# Patient Record
Sex: Female | Born: 1965 | Race: White | Hispanic: No | Marital: Married | Smoking: Never smoker
Health system: Southern US, Community
[De-identification: ages and names within clinical notes are randomized; demographics above are authoritative.]

## PROBLEM LIST (undated history)

## (undated) DIAGNOSIS — M719 Bursopathy, unspecified: Secondary | ICD-10-CM

## (undated) DIAGNOSIS — F329 Major depressive disorder, single episode, unspecified: Secondary | ICD-10-CM

## (undated) DIAGNOSIS — H9190 Unspecified hearing loss, unspecified ear: Secondary | ICD-10-CM

## (undated) DIAGNOSIS — T7840XA Allergy, unspecified, initial encounter: Secondary | ICD-10-CM

## (undated) DIAGNOSIS — E785 Hyperlipidemia, unspecified: Secondary | ICD-10-CM

## (undated) DIAGNOSIS — I10 Essential (primary) hypertension: Secondary | ICD-10-CM

## (undated) DIAGNOSIS — F419 Anxiety disorder, unspecified: Secondary | ICD-10-CM

## (undated) DIAGNOSIS — F32A Depression, unspecified: Secondary | ICD-10-CM

## (undated) DIAGNOSIS — I639 Cerebral infarction, unspecified: Secondary | ICD-10-CM

## (undated) DIAGNOSIS — F988 Other specified behavioral and emotional disorders with onset usually occurring in childhood and adolescence: Secondary | ICD-10-CM

## (undated) DIAGNOSIS — G43909 Migraine, unspecified, not intractable, without status migrainosus: Secondary | ICD-10-CM

## (undated) HISTORY — DX: Migraine, unspecified, not intractable, without status migrainosus: G43.909

## (undated) HISTORY — DX: Hyperlipidemia, unspecified: E78.5

## (undated) HISTORY — DX: Cerebral infarction, unspecified: I63.9

## (undated) HISTORY — DX: Depression, unspecified: F32.A

## (undated) HISTORY — PX: TONSILLECTOMY: SUR1361

## (undated) HISTORY — PX: PECTUS EXCAVATUM REPAIR: SHX437

## (undated) HISTORY — DX: Other specified behavioral and emotional disorders with onset usually occurring in childhood and adolescence: F98.8

## (undated) HISTORY — DX: Allergy, unspecified, initial encounter: T78.40XA

## (undated) HISTORY — PX: SPLENECTOMY: SUR1306

## (undated) HISTORY — DX: Bursopathy, unspecified: M71.9

## (undated) HISTORY — PX: WISDOM TOOTH EXTRACTION: SHX21

## (undated) HISTORY — PX: CHOLECYSTECTOMY: SHX55

## (undated) HISTORY — DX: Unspecified hearing loss, unspecified ear: H91.90

## (undated) HISTORY — DX: Anxiety disorder, unspecified: F41.9

## (undated) HISTORY — DX: Major depressive disorder, single episode, unspecified: F32.9

---

## 2010-01-11 DIAGNOSIS — I639 Cerebral infarction, unspecified: Secondary | ICD-10-CM

## 2010-01-11 HISTORY — DX: Cerebral infarction, unspecified: I63.9

## 2014-05-21 ENCOUNTER — Ambulatory Visit (INDEPENDENT_AMBULATORY_CARE_PROVIDER_SITE_OTHER): Payer: BLUE CROSS/BLUE SHIELD | Admitting: Family Medicine

## 2014-05-21 ENCOUNTER — Encounter: Payer: Self-pay | Admitting: Family Medicine

## 2014-05-21 VITALS — BP 120/80 | HR 72 | Wt 155.2 lb

## 2014-05-21 DIAGNOSIS — J301 Allergic rhinitis due to pollen: Secondary | ICD-10-CM

## 2014-05-21 DIAGNOSIS — F411 Generalized anxiety disorder: Secondary | ICD-10-CM

## 2014-05-21 DIAGNOSIS — I10 Essential (primary) hypertension: Secondary | ICD-10-CM | POA: Diagnosis not present

## 2014-05-21 DIAGNOSIS — Z8673 Personal history of transient ischemic attack (TIA), and cerebral infarction without residual deficits: Secondary | ICD-10-CM

## 2014-05-21 DIAGNOSIS — Z8742 Personal history of other diseases of the female genital tract: Secondary | ICD-10-CM | POA: Diagnosis not present

## 2014-05-21 DIAGNOSIS — H903 Sensorineural hearing loss, bilateral: Secondary | ICD-10-CM

## 2014-05-21 DIAGNOSIS — F909 Attention-deficit hyperactivity disorder, unspecified type: Secondary | ICD-10-CM | POA: Diagnosis not present

## 2014-05-21 DIAGNOSIS — N3281 Overactive bladder: Secondary | ICD-10-CM | POA: Diagnosis not present

## 2014-05-21 DIAGNOSIS — F988 Other specified behavioral and emotional disorders with onset usually occurring in childhood and adolescence: Secondary | ICD-10-CM | POA: Insufficient documentation

## 2014-05-21 DIAGNOSIS — J309 Allergic rhinitis, unspecified: Secondary | ICD-10-CM | POA: Insufficient documentation

## 2014-05-21 NOTE — Progress Notes (Signed)
   Subjective:    Patient ID: Shari Baxter, female    DOB: 27-Oct-1965, 49 y.o.   MRN: [id number]  HPI She is here for a get acquainted visit. She did bring her medical records with her. She has a history of hypertension as well as OAB. She also apparently had abnormal Paps. She was diagnosed recently with ADD and is presently on Ritalin. She does not have that information with her. She also has hearing aids and did bring information concerning the sensorineural hearing loss. She has remote history of CVA. She also is on Celexa for anxiety. Apparently she had also been on all often the past. She would like to be referred for counseling concerning this.  Review of Systems     Objective:   Physical Exam Alert and in no distress otherwise not examined      Assessment & Plan:  Essential hypertension  OAB (overactive bladder)  History of abnormal cervical Pap smear  ADD (attention deficit disorder)  Bilateral sensorineural hearing loss  History of CVA (cerebrovascular accident)  Anxiety state She was given the name and phone number of Darryl Hyers for counseling. She is to get her ADD information before I can renew this. I will review her record and update her file. She is to return here in one month for recheck.

## 2014-05-21 NOTE — Patient Instructions (Signed)
Shari Baxter [phone]

## 2014-05-22 ENCOUNTER — Encounter: Payer: Self-pay | Admitting: Internal Medicine

## 2014-06-05 ENCOUNTER — Encounter: Payer: Self-pay | Admitting: Family Medicine

## 2014-06-24 ENCOUNTER — Encounter: Payer: Self-pay | Admitting: Family Medicine

## 2014-06-24 ENCOUNTER — Ambulatory Visit (INDEPENDENT_AMBULATORY_CARE_PROVIDER_SITE_OTHER): Payer: BLUE CROSS/BLUE SHIELD | Admitting: Family Medicine

## 2014-06-24 VITALS — BP 112/74 | HR 64 | Wt 162.0 lb

## 2014-06-24 DIAGNOSIS — Z1239 Encounter for other screening for malignant neoplasm of breast: Secondary | ICD-10-CM

## 2014-06-24 DIAGNOSIS — F909 Attention-deficit hyperactivity disorder, unspecified type: Secondary | ICD-10-CM

## 2014-06-24 DIAGNOSIS — I10 Essential (primary) hypertension: Secondary | ICD-10-CM | POA: Diagnosis not present

## 2014-06-24 DIAGNOSIS — Z1211 Encounter for screening for malignant neoplasm of colon: Secondary | ICD-10-CM | POA: Diagnosis not present

## 2014-06-24 DIAGNOSIS — N3281 Overactive bladder: Secondary | ICD-10-CM | POA: Diagnosis not present

## 2014-06-24 DIAGNOSIS — Z8742 Personal history of other diseases of the female genital tract: Secondary | ICD-10-CM

## 2014-06-24 DIAGNOSIS — F988 Other specified behavioral and emotional disorders with onset usually occurring in childhood and adolescence: Secondary | ICD-10-CM

## 2014-06-24 MED ORDER — METHYLPHENIDATE HCL ER (CD) 10 MG PO CPCR: 10.0000 mg | ORAL_CAPSULE | ORAL | Status: DC

## 2014-06-24 NOTE — Progress Notes (Signed)
   Subjective:    Patient ID: Shari Baxter, female    DOB: 1965/08/23, 49 y.o.   MRN: [id number]  HPI She is here for a recheck. She apparently does have a history of abnormal Paps and was on every six-month cycle. She has been in this area for less than 6 months and will need a referral. She also has a history of OAB but she is doing well on that. She would like to be placed back on her ADD medication. I could not find her record of the evaluation but did see a note concerning the fact that she does have ADD. She would like to be placed back on Metadate. Review his record indicates she has not had a colonoscopy and does need a mammogram. It was last done in 2014. She is on Celexa,and apparently was placed on this by her GYN for treatment of a rash that was apparently related to anxiety.   Review of Systems     Objective:   Physical Exam Alert and in no distress otherwise not examined       Assessment & Plan:  Essential hypertension  History of abnormal cervical Pap smear - Plan: Ambulatory referral to Gynecology  OAB (overactive bladder)  ADD (attention deficit disorder) - Plan: methylphenidate (METADATE CD) 10 MG CR capsule  Special screening for malignant neoplasms, colon - Plan: Ambulatory referral to Gastroenterology  Screening for breast cancer - Plan: MM DIGITAL SCREENING BILATERAL He is to bring in her ADD evaluation slight can look at it. We cannot find it here. She will continue on her other medications. At the end of the encounter she then mentioned desire for weight loss. I recommended that she return here for further consultation concerning this. Of note is the fact that she was married 3 months ago and is going through major lifestyle changes right now.

## 2014-07-02 ENCOUNTER — Telehealth: Payer: Self-pay | Admitting: Family Medicine

## 2014-07-02 MED ORDER — METHYLPHENIDATE HCL 5 MG PO TABS: 5.0000 mg | ORAL_TABLET | Freq: Two times a day (BID) | ORAL | Status: DC

## 2014-07-02 NOTE — Telephone Encounter (Signed)
Pt stopped by and stated that she thinks the strength of the medication is too strong. She states that it is making her sick. She is requesting something less strong. Pt states that she was previously on generic ritalin 5 mg not 10 mg. She thinks that may be the correct does. Pt can be reached at [phone].

## 2014-07-02 NOTE — Telephone Encounter (Signed)
I will try her on the 5 mg Ritalin and she is to call me to let me know how it works.

## 2014-07-02 NOTE — Telephone Encounter (Signed)
Called pt and left message that  Dr Redmond School has written a new script for Ritalin 5mg  #30 for her to try and call him back to let Dr Redmond School know how she doing after trying the med

## 2014-07-22 ENCOUNTER — Encounter: Payer: BLUE CROSS/BLUE SHIELD | Admitting: Family Medicine

## 2014-07-26 LAB — HM MAMMOGRAPHY

## 2014-07-29 ENCOUNTER — Encounter: Payer: Self-pay | Admitting: Family Medicine

## 2014-07-29 ENCOUNTER — Other Ambulatory Visit (HOSPITAL_COMMUNITY)
Admission: RE | Admit: 2014-07-29 | Discharge: 2014-07-29 | Disposition: A | Discharge status: Home / Self Care | Payer: Self-pay | Source: Ambulatory Visit | Attending: Family Medicine | Admitting: Family Medicine

## 2014-07-29 ENCOUNTER — Ambulatory Visit (INDEPENDENT_AMBULATORY_CARE_PROVIDER_SITE_OTHER): Payer: BLUE CROSS/BLUE SHIELD | Admitting: Family Medicine

## 2014-07-29 VITALS — BP 114/70 | HR 60 | Ht 62.0 in | Wt 157.0 lb

## 2014-07-29 DIAGNOSIS — N3281 Overactive bladder: Secondary | ICD-10-CM | POA: Diagnosis not present

## 2014-07-29 DIAGNOSIS — Z0000 Encounter for general adult medical examination without abnormal findings: Secondary | ICD-10-CM | POA: Diagnosis not present

## 2014-07-29 DIAGNOSIS — Z8673 Personal history of transient ischemic attack (TIA), and cerebral infarction without residual deficits: Secondary | ICD-10-CM | POA: Diagnosis not present

## 2014-07-29 DIAGNOSIS — F909 Attention-deficit hyperactivity disorder, unspecified type: Secondary | ICD-10-CM

## 2014-07-29 DIAGNOSIS — I10 Essential (primary) hypertension: Secondary | ICD-10-CM | POA: Diagnosis not present

## 2014-07-29 DIAGNOSIS — F411 Generalized anxiety disorder: Secondary | ICD-10-CM | POA: Diagnosis not present

## 2014-07-29 DIAGNOSIS — J301 Allergic rhinitis due to pollen: Secondary | ICD-10-CM

## 2014-07-29 DIAGNOSIS — N390 Urinary tract infection, site not specified: Secondary | ICD-10-CM | POA: Diagnosis not present

## 2014-07-29 DIAGNOSIS — Z01419 Encounter for gynecological examination (general) (routine) without abnormal findings: Secondary | ICD-10-CM | POA: Insufficient documentation

## 2014-07-29 DIAGNOSIS — Z8742 Personal history of other diseases of the female genital tract: Secondary | ICD-10-CM

## 2014-07-29 DIAGNOSIS — F988 Other specified behavioral and emotional disorders with onset usually occurring in childhood and adolescence: Secondary | ICD-10-CM

## 2014-07-29 DIAGNOSIS — H903 Sensorineural hearing loss, bilateral: Secondary | ICD-10-CM | POA: Diagnosis not present

## 2014-07-29 DIAGNOSIS — R5383 Other fatigue: Secondary | ICD-10-CM | POA: Diagnosis not present

## 2014-07-29 LAB — CBC WITH DIFFERENTIAL/PLATELET
Basophils Absolute: 0 10*3/uL (ref 0.0–0.1)
Basophils Relative: 0 % (ref 0–1)
EOS ABS: 0.2 10*3/uL (ref 0.0–0.7)
Eosinophils Relative: 2 % (ref 0–5)
HEMATOCRIT: 46.5 % — AB (ref 36.0–46.0)
HEMOGLOBIN: 15.2 g/dL — AB (ref 12.0–15.0)
Lymphocytes Relative: 30 % (ref 12–46)
Lymphs Abs: 2.4 10*3/uL (ref 0.7–4.0)
MCH: 28.5 pg (ref 26.0–34.0)
MCHC: 32.7 g/dL (ref 30.0–36.0)
MCV: 87.1 fL (ref 78.0–100.0)
MONO ABS: 0.6 10*3/uL (ref 0.1–1.0)
MPV: 10.3 fL (ref 8.6–12.4)
Monocytes Relative: 8 % (ref 3–12)
Neutro Abs: 4.9 10*3/uL (ref 1.7–7.7)
Neutrophils Relative %: 60 % (ref 43–77)
PLATELETS: 313 10*3/uL (ref 150–400)
RBC: 5.34 MIL/uL — AB (ref 3.87–5.11)
RDW: 14.1 % (ref 11.5–15.5)
WBC: 8.1 10*3/uL (ref 4.0–10.5)

## 2014-07-29 LAB — POCT URINALYSIS DIPSTICK
BILIRUBIN UA: NEGATIVE
Blood, UA: NEGATIVE
Glucose, UA: NEGATIVE
Ketones, UA: NEGATIVE
LEUKOCYTES UA: NEGATIVE
PH UA: 6
PROTEIN UA: NEGATIVE
Spec Grav, UA: 1.025
Urobilinogen, UA: NEGATIVE

## 2014-07-29 LAB — COMPREHENSIVE METABOLIC PANEL
ALBUMIN: 3.9 g/dL (ref 3.5–5.2)
ALT: 13 U/L (ref 0–35)
AST: 14 U/L (ref 0–37)
Alkaline Phosphatase: 41 U/L (ref 39–117)
BUN: 19 mg/dL (ref 6–23)
CO2: 27 mEq/L (ref 19–32)
Calcium: 8.9 mg/dL (ref 8.4–10.5)
Chloride: 100 mEq/L (ref 96–112)
Creat: 0.85 mg/dL (ref 0.50–1.10)
GLUCOSE: 96 mg/dL (ref 70–99)
Potassium: 4.4 mEq/L (ref 3.5–5.3)
Sodium: 139 mEq/L (ref 135–145)
Total Bilirubin: 0.7 mg/dL (ref 0.2–1.2)
Total Protein: 6.8 g/dL (ref 6.0–8.3)

## 2014-07-29 LAB — LIPID PANEL
CHOLESTEROL: 228 mg/dL — AB (ref 0–200)
HDL: 50 mg/dL (ref >=46)
LDL Cholesterol: 156 mg/dL — ABNORMAL HIGH (ref 0–99)
Total CHOL/HDL Ratio: 4.6 Ratio
Triglycerides: 112 mg/dL (ref <150)
VLDL: 22 mg/dL (ref 0–40)

## 2014-07-29 LAB — TSH: TSH: 1.987 u[IU]/mL (ref 0.350–4.500)

## 2014-07-29 MED ORDER — METHYLPHENIDATE HCL 5 MG PO TABS: 5.0000 mg | ORAL_TABLET | Freq: Every day | ORAL | Status: DC

## 2014-07-29 MED ORDER — METHYLPHENIDATE HCL 5 MG PO TABS: 5.0000 mg | ORAL_TABLET | Freq: Two times a day (BID) | ORAL | Status: DC

## 2014-07-29 MED ORDER — CIPROFLOXACIN HCL 500 MG PO TABS: 500.0000 mg | ORAL_TABLET | Freq: Two times a day (BID) | ORAL | Status: DC

## 2014-07-29 NOTE — Progress Notes (Signed)
Subjective:    Patient ID: Shari Baxter, female    DOB: Jul 16, 1965, 49 y.o.   MRN: [id number]  HPI She is here for a complete examination. She has a very complicated history and is due to my practice. She apparently has had an abnormal Pap that was also positive for HPV and is here for repeat Pap. Her last one was approximately 6 months ago. She has a history of ADD. She states that the Ritalin 20 mg was too strong and 5 mg works much better and last roughly 6 hours. It helps her maintain good focus. She also has a history of allergies and presently is having some nasal congestion, sore throat, rhinorrhea and slight dry cough. She has been taking Benadryl mainly at night and states she does this mainly for sleep. She has a one-week history of headache that is in the occipital area bilaterally. No previous history of difficulty with this. No blurred vision, double vision, nausea, vomiting. She also complains of fatigue and has concerns about her thyroid. No skin or hair changes, cold intolerance. She has been using for aspirin and it occasionally try Excedrin. She also has a previous history of OAB but presently is on no medications. Apparently the medicine she was on was quite expensive. She also has a previous history of CVA. She does wear hearing aids bilaterally she continues on Celexa and seems to be doing fairly well on this. She continues on Corgard for her blood pressure.   Review of Systems  All other systems reviewed and are negative.      Objective:   Physical Exam BP 114/70 mmHg  Pulse 60  Ht 5\' 2"  (1.575 m)  Wt 157 lb (71.215 kg)  BMI 28.71 kg/m2  SpO2 97%  General Appearance:    Alert, cooperative, no distress, appears stated age  Head:    Normocephalic, without obvious abnormality, atraumatic  Eyes:    PERRL, conjunctiva/corneas clear, EOM's intact, fundi    benign  Ears:    Normal TM's and external ear canals  Nose:   Nares normal, mucosa normal, no drainage or sinus    tenderness  Throat:   Lips, mucosa, and tongue normal; teeth and gums normal  Neck:   Supple, no lymphadenopathy;  thyroid:  no   enlargement/tenderness/nodules; no carotid   bruit or JVD  Back:    Spine nontender, no curvature, ROM normal, no CVA     tenderness  Lungs:     Clear to auscultation bilaterally without wheezes, rales or     ronchi; respirations unlabored  Chest Wall:    No tenderness or deformity   Heart:    Regular rate and rhythm, S1 and S2 normal, no murmur, rub   or gallop  Breast Exam:   deferred  Abdomen:     Soft, non-tender, nondistended, normoactive bowel sounds,    no masses, no hepatosplenomegaly  Genitalia:    Normal external genitalia without lesions.  BUS and vagina normal; cervix without lesions, or cervical motion tenderness. No abnormal vaginal discharge.  Uterus and adnexa not enlarged, nontender, no masses.  Pap performed  Rectal:   deferredl  Extremities:   No clubbing, cyanosis or edema  Pulses:   2+ and symmetric all extremities  Skin:   Skin color, texture, turgor normal, no rashes or lesions  Lymph nodes:   Cervical, supraclavicular, and axillary nodes normal  Neurologic:   CNII-XII intact, normal strength, sensation and gait; reflexes 2+ and symmetric throughout  Psych:   Normal mood, affect, hygiene and grooming.    her previous medical records were extensively reviewed. Urine microscopic was positive for bacteria but no white cells, red cells or epithelial cells.     Assessment & Plan:  Routine general medical examination at a health care facility - Plan: POCT Urinalysis Dipstick, CBC with Differential/Platelet, Comprehensive metabolic panel, Lipid panel  Essential hypertension - Plan: POCT Urinalysis Dipstick  History of abnormal cervical Pap smear - Plan: Cytology - PAP [place]  ADD (attention deficit disorder) - Plan: methylphenidate (RITALIN) 5 MG tablet, methylphenidate (RITALIN) 5 MG tablet, methylphenidate (RITALIN) 5 MG  tablet  History of CVA (cerebrovascular accident)  Anxiety state  Allergic rhinitis due to pollen  Other fatigue - Plan: TSH  OAB (overactive bladder)  Bilateral sensorineural hearing loss  Acute UTI - Plan: ciprofloxacin (CIPRO) 500 MG tablet  She is a very complicated lady and it was difficult to have her stay on task in regard to him in one problem at a time. I will place her on Cipro 2 help with the UTI symptoms but also need to be cognizant of her history of possible OAB. Did recommend either Claritin or Allegra for her allergies and to avoid Benadryl at night. She will continue on her other medications and return here in one month for recheck.

## 2014-07-29 NOTE — Patient Instructions (Addendum)
Claritin or Allegra daily and also use Prilosec for the reflux. keep me informed. Take 2 Aleve twice a day for the next week regularly

## 2014-07-30 ENCOUNTER — Encounter: Payer: Self-pay | Admitting: Gastroenterology

## 2014-08-01 LAB — CYTOLOGY - PAP

## 2014-08-05 ENCOUNTER — Encounter: Payer: Self-pay | Admitting: Internal Medicine

## 2014-08-15 MED ORDER — MORPHINE SULFATE 2 MG/ML IJ SOLN
INTRAMUSCULAR | Status: AC
  Filled 2014-08-15: qty 2

## 2014-08-23 ENCOUNTER — Ambulatory Visit (AMBULATORY_SURGERY_CENTER): Payer: Self-pay | Admitting: *Deleted

## 2014-08-23 VITALS — Wt 160.0 lb

## 2014-08-23 DIAGNOSIS — Z1211 Encounter for screening for malignant neoplasm of colon: Secondary | ICD-10-CM

## 2014-08-23 MED ORDER — NA SULFATE-K SULFATE-MG SULF 17.5-3.13-1.6 GM/177ML PO SOLN: 1.0000 | Freq: Once | ORAL | Status: DC

## 2014-08-23 NOTE — Progress Notes (Signed)
No egg or soy allergy. No anesthesia problems.  No diet meds  No home O2

## 2014-08-26 ENCOUNTER — Telehealth: Payer: Self-pay | Admitting: Family Medicine

## 2014-08-26 MED ORDER — CIPROFLOXACIN HCL 500 MG PO TABS: 500.0000 mg | ORAL_TABLET | Freq: Two times a day (BID) | ORAL | Status: DC

## 2014-08-26 NOTE — Telephone Encounter (Signed)
Pt can in and stated that the symptoms of UTI have returned. She is requesting another round of cipro be sent in for her. [facility] uses [facility] [address] and can be reached at [phone]. She is coming in on Thursday for a follow up.

## 2014-08-27 ENCOUNTER — Other Ambulatory Visit: Payer: Self-pay | Admitting: Family Medicine

## 2014-08-29 ENCOUNTER — Ambulatory Visit: Payer: BLUE CROSS/BLUE SHIELD | Admitting: Family Medicine

## 2014-08-29 ENCOUNTER — Telehealth: Payer: Self-pay | Admitting: Gastroenterology

## 2014-08-29 DIAGNOSIS — Z1211 Encounter for screening for malignant neoplasm of colon: Secondary | ICD-10-CM

## 2014-08-29 MED ORDER — NA SULFATE-K SULFATE-MG SULF 17.5-3.13-1.6 GM/177ML PO SOLN: 1.0000 | Freq: Once | ORAL | Status: DC

## 2014-08-29 NOTE — Telephone Encounter (Signed)
Pt sd she couldn't afford prep. Advised pt we have a sample. She will pick up sample in the morning. Advised pt prep will be held on 4th floor at admitting desk. Abigail Butts PV

## 2014-08-30 ENCOUNTER — Other Ambulatory Visit: Payer: Self-pay

## 2014-08-30 ENCOUNTER — Ambulatory Visit (INDEPENDENT_AMBULATORY_CARE_PROVIDER_SITE_OTHER): Payer: BLUE CROSS/BLUE SHIELD | Admitting: Family Medicine

## 2014-08-30 VITALS — BP 110/50 | HR 100 | Resp 12 | Ht 62.0 in | Wt 159.4 lb

## 2014-08-30 DIAGNOSIS — F909 Attention-deficit hyperactivity disorder, unspecified type: Secondary | ICD-10-CM

## 2014-08-30 DIAGNOSIS — N3281 Overactive bladder: Secondary | ICD-10-CM | POA: Diagnosis not present

## 2014-08-30 DIAGNOSIS — B351 Tinea unguium: Secondary | ICD-10-CM

## 2014-08-30 DIAGNOSIS — J3089 Other allergic rhinitis: Secondary | ICD-10-CM

## 2014-08-30 DIAGNOSIS — F988 Other specified behavioral and emotional disorders with onset usually occurring in childhood and adolescence: Secondary | ICD-10-CM

## 2014-08-30 DIAGNOSIS — R300 Dysuria: Secondary | ICD-10-CM

## 2014-08-30 LAB — POCT URINALYSIS DIPSTICK
Glucose, UA: NEGATIVE
Ketones, UA: NEGATIVE
Nitrite, UA: POSITIVE
PROTEIN UA: NEGATIVE
UROBILINOGEN UA: 2
pH, UA: 5

## 2014-08-30 NOTE — Progress Notes (Signed)
   Subjective:    Patient ID: Shari Baxter, female    DOB: 22-Jun-1965, 49 y.o.   MRN: [id number]  HPI She is here for a recheck. She recently started having difficulty with dysuria. She has been on Azo-Standard. Cipro was called in on the 15th however she is not taking it due to work problems and unable to pick it up. She has a history of OAB. She also has difficulty with both fifth toes getting thick and difficult to cut. She has difficulty with this in the past and did have the nails removed however the thickening has reoccurred. She is unable to cut them herself. She takes 5 mg of Ritalin and states that it helps for approximately 8 hours. It does give her energy and helps her stay focused. When wears off she does become fatigued. Her allergies are under good control.   Review of Systems     Objective:   Physical Exam Alert and in no distress. Exam of both feet shows thickening and a long 50 toenails. Urine microscopic examination did show bacteria as well as white cells. It was difficult to see due to her Azo-Standard.       Assessment & Plan:  Dysuria - Plan: Urinalysis Dipstick  Onychomycosis - Plan: Ambulatory referral to Podiatry  OAB (overactive bladder)  Other allergic rhinitis  ADD (attention deficit disorder) She is to take Cipro and return here in 2 weeks for a recheck on her urine. I will set her up to see podiatry for her nails. Discussed possible use of Lamisil to help for the onychomycosis. Continue on her allergy medications. Discussed possibly switching to a different ADD med but she would like to stay with what she is on.

## 2014-09-06 ENCOUNTER — Encounter: Payer: Self-pay | Admitting: Gastroenterology

## 2014-09-06 ENCOUNTER — Ambulatory Visit (AMBULATORY_SURGERY_CENTER): Payer: BLUE CROSS/BLUE SHIELD | Admitting: Gastroenterology

## 2014-09-06 VITALS — BP 132/72 | HR 80 | Temp 98.7°F | Resp 16 | Ht 62.0 in | Wt 160.0 lb

## 2014-09-06 DIAGNOSIS — K635 Polyp of colon: Secondary | ICD-10-CM

## 2014-09-06 DIAGNOSIS — D124 Benign neoplasm of descending colon: Secondary | ICD-10-CM

## 2014-09-06 DIAGNOSIS — D122 Benign neoplasm of ascending colon: Secondary | ICD-10-CM | POA: Diagnosis not present

## 2014-09-06 DIAGNOSIS — Z1211 Encounter for screening for malignant neoplasm of colon: Secondary | ICD-10-CM | POA: Diagnosis present

## 2014-09-06 MED ORDER — SODIUM CHLORIDE 0.9 % IV SOLN: 500.0000 mL | INTRAVENOUS | Status: DC

## 2014-09-06 NOTE — Op Note (Signed)
[facility]  [address] [place], [id number]   COLONOSCOPY PROCEDURE REPORT  PATIENT: Shari Baxter, Shari Baxter  MR#: [id number] BIRTHDATE: [birth year] , 49  yrs. old GENDER: female ENDOSCOPIST: Ladene Artist, MD, [facility] REFERRED YC:XKGYJ Tysinger, PA-C PROCEDURE DATE:  09/06/2014 PROCEDURE:   Colonoscopy, screening and Colonoscopy with snare polypectomy First Screening Colonoscopy - Avg.  risk and is 50 yrs.  old or older Yes.  Prior Negative Screening - Now for repeat screening. N/A  History of Adenoma - Now for follow-up colonoscopy & has been > or = to 3 yrs.  N/A  Polyps removed today? Yes ASA CLASS:   Class II INDICATIONS:Screening for colonic neoplasia and Colorectal Neoplasm Risk Assessment for this procedure is average risk. MEDICATIONS: Monitored anesthesia care and Propofol 200 mg IV DESCRIPTION OF PROCEDURE:   After the risks benefits and alternatives of the procedure were thoroughly explained, informed consent was obtained.  The digital rectal exam revealed no abnormalities of the rectum.   The LB [id number]  endoscope was introduced through the anus and advanced to the cecum, which was identified by both the appendix and ileocecal valve. No adverse events experienced.   The quality of the prep was good.  (Suprep was used)  The instrument was then slowly withdrawn as the colon was fully examined. Estimated blood loss is zero unless otherwise noted in this procedure report.    COLON FINDINGS: Two sessile polyps measuring 6-7 mm in size were found in the descending colon and ascending colon.  Polypectomies were performed with a cold snare.  The resection was complete, the polyp tissue was completely retrieved and sent to histology.   The examination was otherwise normal.  Retroflexed views revealed no abnormalities. The time to cecum = 3.0 Withdrawal time = 11.7   The scope was withdrawn and the procedure completed. COMPLICATIONS: There were no  immediate complications.  ENDOSCOPIC IMPRESSION: 1.   Two sessile polyps in the descending colon and ascending colon; polypectomies performed with a cold snare 2.   The examination was otherwise normal  RECOMMENDATIONS: 1.  Await pathology results 2.  Repeat colonoscopy in 5 years if polyp(s) adenomatous; otherwise 10 years 3.  Hold Aspirin and all other NSAIDS for 2 weeks.  eSigned:  Ladene Artist, MD, [facility] 09/06/2014 3:25 PM

## 2014-09-06 NOTE — Progress Notes (Signed)
Called to room to assist during endoscopic procedure.  Patient ID and intended procedure confirmed with present staff. Received instructions for my participation in the procedure from the performing physician.  

## 2014-09-06 NOTE — Progress Notes (Addendum)
PATIENT COMPLAINS OF FRONT TOOTH AND 1ST TOOTH TO THE LEFT DISCOMFORT. PATIENT STATING SHE NORMALLY GETS THIS DISCOMFORT WHEN SHE GETS A SINUS INFECTION. PATIENT WONDERING IF THE 02 CAUSED THIS. ON EXAM NO DEFORMITIES IN TOOTH NOTED. TEMPERATURE AT THIS TIME 97.6. PATIENT AND CARE GIVER KNOW TO CALL WITH TEMP. GREATER THAN 100.INFORMED THE PATIENT THAT SHE WAS NOT INTUBATED FOR THE PROCEDURE AND NO BITEBLOCK WAS USED.

## 2014-09-06 NOTE — Progress Notes (Signed)
Report to PACU, RN, vss, BBS= Clear.  

## 2014-09-06 NOTE — Progress Notes (Signed)
WHILE GETTING DRESSED PATIENT STATING SHE FELT [place] ON SITTING UPRIGHT , "MAYBE BECAUSE SHE TOOK A DRAMAMINE THIS AM PRIOR TO COMING TO THE HOSPITAL(0730) ASSISTED THE PATIENT TO DRESS AND TOOK PATIENT TO CAREGIVERS CAR PER WHEELCHAIR. PATIENT WAS STEADY ON HER FEET. NO FURTHER COMPLAINTS OF TOOTH DISCOMFORT. ASSURED THE PATIENT THAT HER PROCEDURE AND SEDATION WENT WELL. INSTRUCTED THE PATIENT TO CALL WITH ANY PROBLEMS.PATIENT LEAVING WITH BILATERAL HEARING AIDES IN PLACE.

## 2014-09-06 NOTE — Patient Instructions (Signed)
  NO ASPIRIN, ASPIRIN PRODUCTS OR NSAIDS(MOTRIN, ADVIL,ALEVE ETC) FOR TWO WEEKS, September 20, 2014.  YOU HAD AN ENDOSCOPIC PROCEDURE TODAY AT [facility]:   Refer to the procedure report that was given to you for any specific questions about what was found during the examination.  If the procedure report does not answer your questions, please call your gastroenterologist to clarify.  If you requested that your care partner not be given the details of your procedure findings, then the procedure report has been included in a sealed envelope for you to review at your convenience later.  YOU SHOULD EXPECT: Some feelings of bloating in the abdomen. Passage of more gas than usual.  Walking can help get rid of the air that was put into your GI tract during the procedure and reduce the bloating. If you had a lower endoscopy (such as a colonoscopy or flexible sigmoidoscopy) you may notice spotting of blood in your stool or on the toilet paper. If you underwent a bowel prep for your procedure, you may not have a normal bowel movement for a few days.  Please Note:  You might notice some irritation and congestion in your nose or some drainage.  This is from the oxygen used during your procedure.  There is no need for concern and it should clear up in a day or so.  SYMPTOMS TO REPORT IMMEDIATELY:   Following lower endoscopy (colonoscopy or flexible sigmoidoscopy):  Excessive amounts of blood in the stool  Significant tenderness or worsening of abdominal pains  Swelling of the abdomen that is new, acute  Fever of 100F or higher   For urgent or emergent issues, a gastroenterologist can be reached at any hour by calling [phone].   DIET: Your first meal following the procedure should be a small meal and then it is ok to progress to your normal diet. Heavy or fried foods are harder to digest and may make you feel nauseous or bloated.  Likewise, meals heavy in dairy and vegetables can  increase bloating.  Drink plenty of fluids but you should avoid alcoholic beverages for 24 hours.  ACTIVITY:  You should plan to take it easy for the rest of today and you should NOT DRIVE or use heavy machinery until tomorrow (because of the sedation medicines used during the test).    FOLLOW UP: Our staff will call the number listed on your records the next business day following your procedure to check on you and address any questions or concerns that you may have regarding the information given to you following your procedure. If we do not reach you, we will leave a message.  However, if you are feeling well and you are not experiencing any problems, there is no need to return our call.  We will assume that you have returned to your regular daily activities without incident.  If any biopsies were taken you will be contacted by phone or by letter within the next 1-3 weeks.  Please call us at [phone] if you have not heard about the biopsies in 3 weeks.    SIGNATURES/CONFIDENTIALITY: You and/or your care partner have signed paperwork which will be entered into your electronic medical record.  These signatures attest to the fact that that the information above on your After Visit Summary has been reviewed and is understood.  Full responsibility of the confidentiality of this discharge information lies with you and/or your care-partner.

## 2014-09-09 ENCOUNTER — Telehealth: Payer: Self-pay

## 2014-09-09 NOTE — Telephone Encounter (Signed)
Left message on answering machine. 

## 2014-09-10 ENCOUNTER — Encounter: Payer: Self-pay | Admitting: Gastroenterology

## 2014-09-13 ENCOUNTER — Other Ambulatory Visit: Payer: BLUE CROSS/BLUE SHIELD

## 2014-09-24 ENCOUNTER — Other Ambulatory Visit (INDEPENDENT_AMBULATORY_CARE_PROVIDER_SITE_OTHER): Payer: BLUE CROSS/BLUE SHIELD

## 2014-09-24 DIAGNOSIS — R300 Dysuria: Secondary | ICD-10-CM | POA: Diagnosis not present

## 2014-09-24 LAB — POCT URINALYSIS DIPSTICK
BILIRUBIN UA: NEGATIVE
Blood, UA: NEGATIVE
Glucose, UA: NEGATIVE
KETONES UA: NEGATIVE
LEUKOCYTES UA: NEGATIVE
NITRITE UA: NEGATIVE
Protein, UA: POSITIVE
Spec Grav, UA: >=1.03
Urobilinogen, UA: NEGATIVE
pH, UA: 5.5

## 2014-10-07 ENCOUNTER — Other Ambulatory Visit (INDEPENDENT_AMBULATORY_CARE_PROVIDER_SITE_OTHER): Payer: BLUE CROSS/BLUE SHIELD

## 2014-10-07 DIAGNOSIS — Z23 Encounter for immunization: Secondary | ICD-10-CM | POA: Diagnosis not present

## 2014-10-15 ENCOUNTER — Ambulatory Visit: Payer: BLUE CROSS/BLUE SHIELD | Admitting: Podiatry

## 2014-10-16 ENCOUNTER — Ambulatory Visit: Payer: BLUE CROSS/BLUE SHIELD | Admitting: Podiatry

## 2014-10-24 ENCOUNTER — Ambulatory Visit: Payer: BLUE CROSS/BLUE SHIELD | Admitting: Podiatry

## 2014-11-04 ENCOUNTER — Ambulatory Visit: Payer: BLUE CROSS/BLUE SHIELD | Admitting: Podiatry

## 2014-11-18 ENCOUNTER — Ambulatory Visit (INDEPENDENT_AMBULATORY_CARE_PROVIDER_SITE_OTHER): Payer: BLUE CROSS/BLUE SHIELD | Admitting: Podiatry

## 2014-11-18 ENCOUNTER — Encounter: Payer: Self-pay | Admitting: Podiatry

## 2014-11-18 VITALS — BP 138/82 | HR 73 | Resp 16

## 2014-11-18 DIAGNOSIS — L600 Ingrowing nail: Secondary | ICD-10-CM

## 2014-11-18 NOTE — Patient Instructions (Signed)
Long Term Care Instructions-Post Nail Surgery  You have had your ingrown toenail and root treated with a chemical.  This chemical causes a burn that will drain and ooze like a blister.  This can drain for 6-8 weeks or longer.  It is important to keep this area clean, covered, and follow the soaking instructions dispensed at the time of your surgery.  This area will eventually dry and form a scab.  Once the scab forms you no longer need to soak or apply a dressing.  If at any time you experience an increase in pain, redness, swelling, or drainage, you should contact the office as soon as possible.ANTIBACTERIAL SOAP INSTRUCTIONS  THE DAY AFTER PROCEDURE  Please follow the instructions your doctor has marked.   Shower as usual. Before getting out, place a drop of antibacterial liquid soap (Dial) on a wet, clean washcloth.  Gently wipe washcloth over affected area.  Afterward, rinse the area with warm water.  Blot the area dry with a soft cloth and cover with antibiotic ointment (neosporin, polysporin, bacitracin) and band aid or gauze and tape  Place 3-4 drops of antibacterial liquid soap in a quart of warm tap water.  Submerge foot into water for 20 minutes.  If bandage was applied after your procedure, leave on to allow for easy lift off, then remove and continue with soak for the remaining time.  Next, blot area dry with a soft cloth and cover with a bandage.  Apply other medications as directed by your doctor, such as cortisporin otic solution (eardrops) or neosporin antibiotic ointment 

## 2014-11-18 NOTE — Progress Notes (Signed)
   Subjective:    Patient ID: Shari Baxter, female    DOB: [birth year], 49 y.o.   MRN: [id number]  HPI Pt presents with bilateral 5th nails that are thick and painful   Review of Systems  Psychiatric/Behavioral: The patient is nervous/anxious.   All other systems reviewed and are negative.      Objective:   Physical Exam        Assessment & Plan:

## 2014-11-19 ENCOUNTER — Telehealth: Payer: Self-pay | Admitting: *Deleted

## 2014-11-19 NOTE — Telephone Encounter (Signed)
Called patient at [phone] (Home #) to check to see how they were doing from their ingrown toenail procedure that was performed on Monday, November 18, 2014. Pt stated, "toe is sore and had some bleeding". Pt is taking ibuprofen with some relief. Pt will soak their toe at 4:30 pm today for the first time.

## 2014-11-20 NOTE — Progress Notes (Signed)
Subjective:     Patient ID: Shari Baxter, female   DOB: 02/21/1965, 49 y.o.   MRN: [id number]  HPI patient presents with severely damaged fifth nailbeds of both feet that she states that she cannot wear shoe gear with comfortably and she cannot trim anymore and they've been like this for years with a family history of the problem   Review of Systems  All other systems reviewed and are negative.      Objective:   Physical Exam  Constitutional: She is oriented to person, place, and time.  Cardiovascular: Intact distal pulses.   Musculoskeletal: Normal range of motion.  Neurological: She is oriented to person, place, and time.  Skin: Skin is warm.  Nursing note and vitals reviewed.  neurovascular status found to be intact muscle strength adequate range of motion within normal limits with patient noted to have severely dystrophic thickened fifth nailbeds bilateral that are painful when pressed dorsally and she is tried to trim them herself without relief. 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 Patient wants them taken care of and I explained the procedure to do this and risk and she wants surgery. Today I infiltrated each fifth toe 60 mg Xylocaine Marcaine mixture remove the fifth nail bed exposed matrix and applied phenol 3 applications 30 seconds followed by alcohol lavage and sterile dressing. Gave instructions on soaks and reappoint

## 2014-12-03 ENCOUNTER — Telehealth: Payer: Self-pay | Admitting: *Deleted

## 2014-12-03 NOTE — Telephone Encounter (Signed)
Pt states she had toenail procedures 2 weeks ago and the left is healing well, but the right is red around the nail area with swelling.  Pt states she was told to use dish washing detergent to soak the areas. Pt states she has been icing and taking Ibuprofen, and it does feel better  I told pt to stop those soaks and begin the epsom salt soaks as package directs for 20 minutes 2 time a day and cover with the lightly coated antibiotic ointment fabric bandaid or gauze dressing when up and around, or in enclosed shoes, but if resting allow the area to air dry.  I told her the epsom salt soaks were more drying and take Ibuprofen as package states if she is able to tolerate.

## 2014-12-13 ENCOUNTER — Encounter: Payer: Self-pay | Admitting: Podiatry

## 2014-12-13 ENCOUNTER — Ambulatory Visit (INDEPENDENT_AMBULATORY_CARE_PROVIDER_SITE_OTHER): Payer: BLUE CROSS/BLUE SHIELD | Admitting: Podiatry

## 2014-12-13 VITALS — BP 139/84 | HR 78 | Resp 16

## 2014-12-13 DIAGNOSIS — IMO0002 Reserved for concepts with insufficient information to code with codable children: Secondary | ICD-10-CM

## 2014-12-13 DIAGNOSIS — L03039 Cellulitis of unspecified toe: Secondary | ICD-10-CM | POA: Diagnosis not present

## 2014-12-13 DIAGNOSIS — L600 Ingrowing nail: Secondary | ICD-10-CM | POA: Diagnosis not present

## 2014-12-15 NOTE — Progress Notes (Signed)
Subjective:     Patient ID: Shari Baxter, female   DOB: June 20, 1965, 49 y.o.   MRN: [id number]  HPI patient presents stating I was just concerned about the fifth toes on both feet as they were red and they were irritated with crusted tissue I'm worried about infection and they still been discomfort   Review of Systems     Objective:   Physical Exam Neurovascular status intact muscle strength adequate with crusted fifth nailbeds bilateral that are localized in nature with no proximal edema erythema or drainage noted and no other indications of active infective process    Assessment:     Mild local paronychia infection that does not appear to have any systemic element to it    Plan:     Advised on the utilization of soaks Band-Aids wider shoes and that this should heal uneventfully in the next several weeks. Reappoint if any increased drainage or anything proximal were to occur

## 2014-12-30 ENCOUNTER — Telehealth: Payer: Self-pay | Admitting: Family Medicine

## 2014-12-30 MED ORDER — CITALOPRAM HYDROBROMIDE 20 MG PO TABS: 20.0000 mg | ORAL_TABLET | Freq: Every day | ORAL | Status: DC

## 2014-12-30 NOTE — Telephone Encounter (Signed)
Patient wants refill on citalopram 20 mg, she wants 90 day supply  [facility]

## 2015-05-10 ENCOUNTER — Emergency Department (HOSPITAL_COMMUNITY): Payer: Self-pay

## 2015-05-10 ENCOUNTER — Encounter (HOSPITAL_COMMUNITY): Payer: Self-pay

## 2015-05-10 ENCOUNTER — Ambulatory Visit (HOSPITAL_COMMUNITY)
Admission: EM | Admit: 2015-05-10 | Discharge: 2015-05-10 | Disposition: A | Discharge status: Nursing Home (Includes ICF) | Payer: BLUE CROSS/BLUE SHIELD | Attending: Emergency Medicine | Admitting: Emergency Medicine

## 2015-05-10 ENCOUNTER — Emergency Department (HOSPITAL_COMMUNITY)
Admission: EM | Admit: 2015-05-10 | Discharge: 2015-05-11 | Disposition: A | Discharge status: Home / Self Care | Payer: Self-pay | Attending: Emergency Medicine | Admitting: Emergency Medicine

## 2015-05-10 DIAGNOSIS — R202 Paresthesia of skin: Secondary | ICD-10-CM

## 2015-05-10 DIAGNOSIS — Z8673 Personal history of transient ischemic attack (TIA), and cerebral infarction without residual deficits: Secondary | ICD-10-CM | POA: Insufficient documentation

## 2015-05-10 DIAGNOSIS — Z8639 Personal history of other endocrine, nutritional and metabolic disease: Secondary | ICD-10-CM | POA: Insufficient documentation

## 2015-05-10 DIAGNOSIS — R51 Headache: Secondary | ICD-10-CM | POA: Insufficient documentation

## 2015-05-10 DIAGNOSIS — R200 Anesthesia of skin: Secondary | ICD-10-CM

## 2015-05-10 DIAGNOSIS — F909 Attention-deficit hyperactivity disorder, unspecified type: Secondary | ICD-10-CM | POA: Insufficient documentation

## 2015-05-10 DIAGNOSIS — Z8739 Personal history of other diseases of the musculoskeletal system and connective tissue: Secondary | ICD-10-CM | POA: Insufficient documentation

## 2015-05-10 DIAGNOSIS — Z79899 Other long term (current) drug therapy: Secondary | ICD-10-CM | POA: Insufficient documentation

## 2015-05-10 DIAGNOSIS — D329 Benign neoplasm of meninges, unspecified: Secondary | ICD-10-CM | POA: Insufficient documentation

## 2015-05-10 DIAGNOSIS — F329 Major depressive disorder, single episode, unspecified: Secondary | ICD-10-CM | POA: Insufficient documentation

## 2015-05-10 DIAGNOSIS — R519 Headache, unspecified: Secondary | ICD-10-CM

## 2015-05-10 DIAGNOSIS — H538 Other visual disturbances: Secondary | ICD-10-CM

## 2015-05-10 DIAGNOSIS — G4453 Primary thunderclap headache: Secondary | ICD-10-CM

## 2015-05-10 DIAGNOSIS — F419 Anxiety disorder, unspecified: Secondary | ICD-10-CM | POA: Insufficient documentation

## 2015-05-10 DIAGNOSIS — H9193 Unspecified hearing loss, bilateral: Secondary | ICD-10-CM | POA: Insufficient documentation

## 2015-05-10 DIAGNOSIS — I10 Essential (primary) hypertension: Secondary | ICD-10-CM | POA: Insufficient documentation

## 2015-05-10 LAB — CBC
HEMATOCRIT: 47.3 % — AB (ref 36.0–46.0)
Hemoglobin: 15.8 g/dL — ABNORMAL HIGH (ref 12.0–15.0)
MCH: 28.4 pg (ref 26.0–34.0)
MCHC: 33.4 g/dL (ref 30.0–36.0)
MCV: 85.1 fL (ref 78.0–100.0)
Platelets: 323 10*3/uL (ref 150–400)
RBC: 5.56 MIL/uL — ABNORMAL HIGH (ref 3.87–5.11)
RDW: 13.6 % (ref 11.5–15.5)
WBC: 14.3 10*3/uL — ABNORMAL HIGH (ref 4.0–10.5)

## 2015-05-10 LAB — DIFFERENTIAL
BASOS PCT: 0 %
Basophils Absolute: 0.1 10*3/uL (ref 0.0–0.1)
EOS ABS: 0.1 10*3/uL (ref 0.0–0.7)
Eosinophils Relative: 0 %
Lymphocytes Relative: 20 %
Lymphs Abs: 2.9 10*3/uL (ref 0.7–4.0)
MONO ABS: 1 10*3/uL (ref 0.1–1.0)
MONOS PCT: 7 %
Neutro Abs: 10.4 10*3/uL — ABNORMAL HIGH (ref 1.7–7.7)
Neutrophils Relative %: 73 %

## 2015-05-10 LAB — I-STAT CHEM 8, ED
BUN: 19 mg/dL (ref 6–20)
Calcium, Ion: 1.01 mmol/L — ABNORMAL LOW (ref 1.12–1.23)
Chloride: 100 mmol/L — ABNORMAL LOW (ref 101–111)
Creatinine, Ser: 0.8 mg/dL (ref 0.44–1.00)
GLUCOSE: 118 mg/dL — AB (ref 65–99)
HCT: 55 % — ABNORMAL HIGH (ref 36.0–46.0)
HEMOGLOBIN: 18.7 g/dL — AB (ref 12.0–15.0)
POTASSIUM: 3.6 mmol/L (ref 3.5–5.1)
Sodium: 137 mmol/L (ref 135–145)
TCO2: 23 mmol/L (ref 0–100)

## 2015-05-10 LAB — COMPREHENSIVE METABOLIC PANEL
ALT: 27 U/L (ref 14–54)
AST: 28 U/L (ref 15–41)
Albumin: 4.2 g/dL (ref 3.5–5.0)
Alkaline Phosphatase: 54 U/L (ref 38–126)
Anion gap: 14 (ref 5–15)
BUN: 16 mg/dL (ref 6–20)
CHLORIDE: 99 mmol/L — AB (ref 101–111)
CO2: 24 mmol/L (ref 22–32)
CREATININE: 0.86 mg/dL (ref 0.44–1.00)
Calcium: 9.4 mg/dL (ref 8.9–10.3)
Glucose, Bld: 119 mg/dL — ABNORMAL HIGH (ref 65–99)
POTASSIUM: 3.8 mmol/L (ref 3.5–5.1)
Sodium: 137 mmol/L (ref 135–145)
Total Bilirubin: 0.6 mg/dL (ref 0.3–1.2)
Total Protein: 7.5 g/dL (ref 6.5–8.1)

## 2015-05-10 LAB — PROTIME-INR
INR: 0.97 (ref 0.00–1.49)
Prothrombin Time: 13.1 seconds (ref 11.6–15.2)

## 2015-05-10 LAB — I-STAT TROPONIN, ED: TROPONIN I, POC: 0.01 ng/mL (ref 0.00–0.08)

## 2015-05-10 LAB — APTT: aPTT: 33 seconds (ref 24–37)

## 2015-05-10 MED ORDER — SODIUM CHLORIDE 0.9 % IV SOLN: Freq: Once | INTRAVENOUS | Status: DC

## 2015-05-10 MED ORDER — LORAZEPAM 2 MG/ML IJ SOLN
1.0000 mg | Freq: Once | INTRAMUSCULAR | Status: DC
  Filled 2015-05-10: qty 1

## 2015-05-10 MED ORDER — DIPHENHYDRAMINE HCL 50 MG/ML IJ SOLN: 50.0000 mg | Freq: Once | INTRAMUSCULAR | Status: DC

## 2015-05-10 MED ORDER — HYDROMORPHONE HCL 1 MG/ML IJ SOLN
2.0000 mg | Freq: Once | INTRAMUSCULAR | Status: AC
  Administered 2015-05-10: 2 mg via INTRAVENOUS
  Filled 2015-05-10: qty 2

## 2015-05-10 MED ORDER — METOCLOPRAMIDE HCL 5 MG/ML IJ SOLN
10.0000 mg | Freq: Once | INTRAMUSCULAR | Status: AC
  Administered 2015-05-11: 10 mg via INTRAVENOUS
  Filled 2015-05-10: qty 2

## 2015-05-10 MED ORDER — ONDANSETRON HCL 4 MG/2ML IJ SOLN
4.0000 mg | Freq: Once | INTRAMUSCULAR | Status: DC
Start: 2015-05-10 — End: 2015-05-10

## 2015-05-10 MED ORDER — ONDANSETRON HCL 4 MG/2ML IJ SOLN
2.0000 mg | Freq: Once | INTRAMUSCULAR | Status: AC
  Administered 2015-05-10: 2 mg via INTRAVENOUS
  Filled 2015-05-10: qty 2

## 2015-05-10 NOTE — Discharge Instructions (Signed)
If you were given medicines take as directed.  If you are on coumadin or contraceptives realize their levels and effectiveness is altered by many different medicines.  If you have any reaction (rash, tongues swelling, other) to the medicines stop taking and see a physician.    Your blood pressure was elevated in the ER make sure you follow up for management with a primary doctor or return for chest pain, shortness of breath or stroke symptoms.  Please follow up as directed and return to the ER or see a physician for new or worsening symptoms.  Thank you.

## 2015-05-10 NOTE — ED Notes (Signed)
Pt. Went to MRI at 2136

## 2015-05-10 NOTE — ED Provider Notes (Signed)
CSN: [id number]     Arrival date & time 05/10/15  2050 History   First MD Initiated Contact with Patient 05/10/15 2052     Chief Complaint  Patient presents with  . Code Stroke     (Consider location/radiation/quality/duration/timing/severity/associated sxs/prior Treatment) HPI Comments: 50 old female with high blood pressure, stroke, anxiety presents with headache and possible strokelike symptoms. Patient was called code stroke in the field. Patient seen urgent care with dizziness, severe headache has gradually worsened and tingling left arm. No focal weakness. No vision complaints. Patient denies blood thinners. Symptoms fairly constant since 4:30 today.  The history is provided by the patient.    Past Medical History  Diagnosis Date  . Anxiety   . Allergy     seasonal  . Depression     history  . Bursitis     left arm  . Hyperlipidemia     no meds, diet controlled  . Stroke [facility]) 2012    some left sided weakness  . ADD (attention deficit disorder)   . Hearing loss     bilateral - wears hearing aids  . Migraines    Past Surgical History  Procedure Laterality Date  . Cholecystectomy    . Splenectomy N/A   . Pectus excavatum repair N/A age 18-1/2  . Wisdom tooth extraction    . Tonsillectomy     Family History  Problem Relation Age of Onset  . Colon cancer Neg Hx    Social History  Substance Use Topics  . Smoking status: Never Smoker   . Smokeless tobacco: Never Used     Comment: husband smokes  . Alcohol Use: 0.0 oz/week    0 Standard drinks or equivalent per week     Comment: socially   OB History    No data available     Review of Systems  Constitutional: Negative for fever and chills.  HENT: Negative for congestion.   Eyes: Negative for visual disturbance.  Respiratory: Negative for shortness of breath.   Cardiovascular: Negative for chest pain.  Gastrointestinal: Negative for vomiting and abdominal pain.  Genitourinary: Negative for dysuria and  flank pain.  Musculoskeletal: Negative for back pain, neck pain and neck stiffness.  Skin: Negative for rash.  Neurological: Positive for headaches. Negative for weakness and light-headedness.      Allergies  Sulfa antibiotics  Home Medications   Prior to Admission medications   Medication Sig Start Date End Date Taking? Authorizing Provider  acyclovir ointment (ZOVIRAX) 5 % Apply 1 application topically every 3 (three) hours.    Historical Provider, MD  citalopram (CELEXA) 20 MG tablet Take 1 tablet (20 mg total) by mouth daily. 12/30/14   Denita Lung, MD  fexofenadine-pseudoephedrine (ALLEGRA-D 24) 180-240 MG per 24 hr tablet Take 1 tablet by mouth daily as needed.    Historical Provider, MD  methylphenidate (RITALIN) 5 MG tablet Take 1 tablet (5 mg total) by mouth daily. 08/29/14   Denita Lung, MD  metoCLOPramide (REGLAN) 10 MG tablet Take 1 tablet (10 mg total) by mouth every 6 (six) hours. 05/11/15   Elnora Morrison, MD   BP 166/111 mmHg  Pulse 89  Resp 19  SpO2 98%  LMP 04/03/2015 (Approximate) Physical Exam  Constitutional: She is oriented to person, place, and time. She appears well-developed and well-nourished.  HENT:  Head: Normocephalic and atraumatic.  Eyes: Conjunctivae are normal. Right eye exhibits no discharge. Left eye exhibits no discharge.  Neck: Normal range of motion. Neck  supple. No tracheal deviation present.  Cardiovascular: Normal rate and regular rhythm.   Pulmonary/Chest: Effort normal and breath sounds normal.  Abdominal: Soft. She exhibits no distension. There is no tenderness. There is no guarding.  Musculoskeletal: She exhibits no edema.  Neurological: She is alert and oriented to person, place, and time. GCS eye subscore is 4. GCS verbal subscore is 5. GCS motor subscore is 6.  5+ strength in UE and LE with f/e at major joints. Sensation to palpation intact in UE and LE. CNs 2-12 grossly intact.  EOMFI.  PERRL.   Finger nose and coordination  intact bilateral.   Visual fields intact to finger testing. No nystagmus   Skin: Skin is warm. No rash noted.  Psychiatric: She has a normal mood and affect.  Nursing note and vitals reviewed.   ED Course  Procedures (including critical care time) Labs Review Labs Reviewed  CBC - Abnormal; Notable for the following:    WBC 14.3 (*)    RBC 5.56 (*)    Hemoglobin 15.8 (*)    HCT 47.3 (*)    All other components within normal limits  DIFFERENTIAL - Abnormal; Notable for the following:    Neutro Abs 10.4 (*)    All other components within normal limits  COMPREHENSIVE METABOLIC PANEL - Abnormal; Notable for the following:    Chloride 99 (*)    Glucose, Bld 119 (*)    All other components within normal limits  I-STAT CHEM 8, ED - Abnormal; Notable for the following:    Chloride 100 (*)    Glucose, Bld 118 (*)    Calcium, Ion 1.01 (*)    Hemoglobin 18.7 (*)    HCT 55.0 (*)    All other components within normal limits  PROTIME-INR  APTT  URINALYSIS, ROUTINE W REFLEX MICROSCOPIC (NOT AT [facility])  I-STAT TROPOININ, ED  CBG MONITORING, ED    Imaging Review Ct Head Wo Contrast  05/10/2015  CLINICAL DATA:  Code stroke. Right eye pressure and pain, with severe headache. Initial encounter. EXAM: CT HEAD WITHOUT CONTRAST TECHNIQUE: Contiguous axial images were obtained from the base of the skull through the vertex without intravenous contrast. COMPARISON:  None. FINDINGS: A 1.1 cm focus of increased attenuation at the peripheral right parietal lobe is thought to reflect an extra-axial meningioma, though a focus of hemorrhage cannot be entirely excluded. Mild periventricular and subcortical white matter change likely reflects small vessel ischemic microangiopathy. Small chronic lacunar infarcts are seen at the basal ganglia bilaterally. The brainstem and fourth ventricle are within normal limits. The basal ganglia are unremarkable in appearance. No midline shift is seen. There is no evidence of  fracture; visualized osseous structures are unremarkable in appearance. The visualized portions of the orbits are within normal limits. The paranasal sinuses and mastoid air cells are well-aerated. No significant soft tissue abnormalities are seen. IMPRESSION: 1. 1.1 cm focus of increased attenuation at the peripheral right parietal lobe is thought to reflect an extra-axial meningioma, though a focus of hemorrhage cannot be entirely excluded. MRI of the brain is recommended for further evaluation. 2. Mild small vessel ischemic microangiopathy. Small chronic lacunar infarcts at the basal ganglia bilaterally. Electronically Signed   By: Garald Balding M.D.   On: 05/10/2015 21:14   Mr Brain Wo Contrast  05/10/2015  CLINICAL DATA:  Headache and hypertension beginning at 1630 hours today. Dizziness, lightheadedness and nausea. Code stroke. History of migraines, hyperlipidemia. Follow-up abnormal CT head. EXAM: MRI HEAD WITHOUT CONTRAST TECHNIQUE:  Multiplanar, multiecho pulse sequences of the brain and surrounding structures were obtained without intravenous contrast. COMPARISON:  CT HEAD May 10, 2015 at 2053 hours FINDINGS: INTRACRANIAL CONTENTS: No reduced diffusion to suggest acute ischemia. No susceptibility artifact to suggest hemorrhage. The ventricles and sulci are normal for patient's age. No mass lesions, mass effect. Old Bilateral basal ganglia lacunar infarcts. FLAIR T2 hyperintensity within and about the basal ganglia. Patchy supratentorial white matter FLAIR T2 hyperintensities extending to internal capsule. Symmetric prominent basal ganglia perivascular spaces. No abnormal extra-axial fluid collections. Intermediate T1, low T2 13 x 14 x 10 mm (AP by CC by transverse) RIGHT frontal extra-axial mass. Normal major intracranial vascular flow voids present at skull base. ORBITS: The included ocular globes and orbital contents are non-suspicious. SINUSES: The mastoid air-cells and included paranasal sinuses  are well-aerated. SKULL/SOFT TISSUES: Expanded partially empty sella. Generalized lobe T1 bone marrow signal. Craniocervical junction maintained. IMPRESSION: 13 x 14 x 10 mm RIGHT frontal meningioma. No intracranial hemorrhage. Bright signal about the basal ganglia can be seen with hypertensive encephalopathy. Moderate white matter changes advanced for age most compatible with chronic small vessel ischemic disease. Partially empty sella. Electronically Signed   By: Elon Alas M.D.   On: 05/10/2015 22:38   I have personally reviewed and evaluated these images and lab results as part of my medical decision-making.   EKG Interpretation   Date/Time:  Saturday May 10 2015 21:15:30 EDT Ventricular Rate:  94 PR Interval:  185 QRS Duration: 80 QT Interval:  378 QTC Calculation: 473 R Axis:   96 Text Interpretation:  Sinus rhythm Biatrial enlargement Probable lateral  infarct, old Confirmed by Milbert Bixler  MD, Lynanne Delgreco ([id number]) on 05/10/2015 11:07:49  PM      MDM   Final diagnoses:  Headache, unspecified headache type  Essential hypertension  Meningioma [facility])   Patient presents as code stroke, patient evaluated on arrival and no focal deficits protecting her airway well. CT scan shows possible meningioma. Neurology assessed in the ER and recommended MRI and supportive care. Neurologist recommends outpatient follow-up if MRI has no acute findings. We'll treat headache symptomatically.  MRI revealed meningioma. No acute findings. Neurology recommends outpatient follow-up  Patient sleeping comfortably. Plan to reassess after observation for repeat neuro exam and ambulation.  Results and differential diagnosis were discussed with the patient/parent/guardian. Xrays were independently reviewed by myself.  Close follow up outpatient was discussed, comfortable with the plan.   Medications  LORazepam (ATIVAN) injection 1 mg (1 mg Intravenous Not Given 05/10/15 2119)  HYDROmorphone (DILAUDID)  injection 2 mg (2 mg Intravenous Given 05/10/15 2119)  ondansetron [facility]) injection 2 mg (2 mg Intravenous Given 05/10/15 2119)  metoCLOPramide (REGLAN) injection 10 mg (10 mg Intravenous Given 05/11/15 0047)    Filed Vitals:   05/10/15 2345 05/11/15 0000 05/11/15 0030 05/11/15 0045  BP: 166/102 175/107 166/111   Pulse: 90 86 87 89  Resp: 22 16 18 19   SpO2: 98% 98% 99% 98%    Final diagnoses:  Headache, unspecified headache type  Essential hypertension  Meningioma (HCC)      Elnora Morrison, MD 05/11/15 [phone]

## 2015-05-10 NOTE — ED Notes (Signed)
Pt. Was seen at urgent care today for headache and high blood pressure since 1630 today. Pt. Also complains of dizziness, light headed, and nausea since 1630 today. Pt. Was transported from urgent care to ED at possible Stroke. Urgent care called and activated code stroke at 2019. Pt. Is awake and alert at this time screaming about her headache. Pt. Is able to move all extremities at this time. Pt. Complains of feeling tingling in left arm.

## 2015-05-10 NOTE — ED Notes (Signed)
Patient presents with headache and nausea, tingling on left side, hx of stroke about 47yrs ago, headaches started about 2:30 pm this afternoon, BP is elevated 195/109 patient is on Ramipril for hypertension but she did not take medication today. No acute distress

## 2015-05-10 NOTE — ED Provider Notes (Signed)
CSN: [id number]     Arrival date & time 05/10/15  1952 History   First MD Initiated Contact with Patient 05/10/15 2009     Chief Complaint  Patient presents with  . Headache   (Consider location/radiation/quality/duration/timing/severity/associated sxs/prior Treatment) HPI  She is a 50 year old woman here for evaluation of headache.  She states that 4:30 this afternoon she developed sudden onset of severe 10 out of 10 headache. It is right-sided. She reports blurred vision in the right eye as well as some photophobia. This is associated with some numbness and tingling of her left arm and her tongue. A friend stated that the left side of her face was twitching as well.  She reports nausea associated with the headache, but no vomiting.  She does have a history of migraine headaches, but states those typically have prodromal nausea and come on more gradually.  She has a history of a stroke 5 years ago when she was living in [place].  She states this headache feels similar to the one that preceded her stroke previously.  She is not currently on any blood thinners or aspirin.  She states her blood pressure is typically well controlled with her ramipril. She did not take her ramipril today.  Past Medical History  Diagnosis Date  . Anxiety   . Allergy     seasonal  . Depression     history  . Bursitis     left arm  . Hyperlipidemia     no meds, diet controlled  . Stroke [facility]) 2012    some left sided weakness  . ADD (attention deficit disorder)   . Hearing loss     bilateral - wears hearing aids  . Migraines    Past Surgical History  Procedure Laterality Date  . Cholecystectomy    . Splenectomy N/A   . Pectus excavatum repair N/A age 83-1/2  . Wisdom tooth extraction    . Tonsillectomy     Family History  Problem Relation Age of Onset  . Colon cancer Neg Hx    Social History  Substance Use Topics  . Smoking status: Never Smoker   . Smokeless tobacco: Never Used     Comment:  husband smokes  . Alcohol Use: 0.0 oz/week    0 Standard drinks or equivalent per week     Comment: socially   OB History    No data available     Review of Systems As in history of present illness Allergies  Sulfa antibiotics  Home Medications   Prior to Admission medications   Medication Sig Start Date End Date Taking? Authorizing Provider  citalopram (CELEXA) 20 MG tablet Take 1 tablet (20 mg total) by mouth daily. 12/30/14  Yes Denita Lung, MD  acyclovir ointment (ZOVIRAX) 5 % Apply 1 application topically every 3 (three) hours.    Historical Provider, MD  fexofenadine-pseudoephedrine (ALLEGRA-D 24) 180-240 MG per 24 hr tablet Take 1 tablet by mouth daily as needed.    Historical Provider, MD  methylphenidate (RITALIN) 5 MG tablet Take 1 tablet (5 mg total) by mouth daily. 08/29/14   Denita Lung, MD   Meds Ordered and Administered this Visit   Medications - No data to display  BP 195/109 mmHg  Pulse 100  Temp(Src) 98.7 F (37.1 C) (Oral)  SpO2 100%  LMP 04/03/2015 (Approximate) No data found.   Physical Exam  Constitutional: She is oriented to person, place, and time. She appears well-developed and well-nourished. She appears distressed.  HENT:  Mouth/Throat: Oropharynx is clear and moist. No oropharyngeal exudate.  Eyes: Conjunctivae and EOM are normal. Pupils are equal, round, and reactive to light.  Neck: Neck supple.  Cardiovascular: Normal rate, regular rhythm and normal heart sounds.   No murmur heard. Pulmonary/Chest: Effort normal and breath sounds normal. No respiratory distress. She has no wheezes. She has no rales.  Neurological: She is alert and oriented to person, place, and time. No cranial nerve deficit. She exhibits normal muscle tone. Coordination normal.  No focal neuro deficit.    ED Course  Procedures (including critical care time)  Labs Review Labs Reviewed - No data to display  Imaging Review No results found.   MDM   1.  Thunderclap headache   2. Numbness and tingling in left arm   3. Blurred vision, right eye    With history of stroke, sudden onset severe headache, and reported numbness and tingling will transfer to [facility] ED via EMS for code stroke. EMS arrived prior to starting IV.    Melony Overly, MD 05/10/15 2043

## 2015-05-11 DIAGNOSIS — R208 Other disturbances of skin sensation: Secondary | ICD-10-CM

## 2015-05-11 DIAGNOSIS — G43909 Migraine, unspecified, not intractable, without status migrainosus: Secondary | ICD-10-CM

## 2015-05-11 MED ORDER — METOCLOPRAMIDE HCL 10 MG PO TABS: 10.0000 mg | ORAL_TABLET | Freq: Four times a day (QID) | ORAL | Status: DC

## 2015-05-11 NOTE — Consult Note (Signed)
Admission H&P    Chief Complaint: Severe headache and left arm numbness  HPI: Shari Baxter is an 50 y.o. female with a history of right cerebral infarction, rate headaches, hyperlipidemia, hearing loss and depression and anxiety, presenting with severe headache of sudden onset as well as numbness involving left upper extremity. Onset was at 4:30 PM. She describes the headache as 10/10 in intensity. She also complained of nausea, and addition to left upper extremity numbness. CT scan of the head showed an area of increased density right posterior frontal region which was extra-axial and consistent with meningioma. No acute hemorrhage was noted. MRI was subsequently obtained which showed no signs of an acute ischemic lesion. Right frontal lesion consistent with meningioma was also noted. She was given IV Dilaudid for pain, 2 mg, which seemed to have relieved her pain transiently. Exam showed no focal deficits other than hearing loss and left lower facial weakness which was old and verified with previously taken photograph. She arrived in code stroke status from the urgent care clinic. Code stroke was subsequently canceled.  Past Medical History  Diagnosis Date  . Anxiety   . Allergy     seasonal  . Depression     history  . Bursitis     left arm  . Hyperlipidemia     no meds, diet controlled  . Stroke [facility]) 2012    some left sided weakness  . ADD (attention deficit disorder)   . Hearing loss     bilateral - wears hearing aids  . Migraines     Past Surgical History  Procedure Laterality Date  . Cholecystectomy    . Splenectomy N/A   . Pectus excavatum repair N/A age 72-1/2  . Wisdom tooth extraction    . Tonsillectomy      Family History  Problem Relation Age of Onset  . Colon cancer Neg Hx    Social History:  reports that she has never smoked. She has never used smokeless tobacco. She reports that she drinks alcohol. She reports that she does not use illicit drugs.  Allergies:   Allergies  Allergen Reactions  . Sulfa Antibiotics Itching and Rash    Medications: Preadmission medications were reviewed.  ROS: History obtained from the patient  General ROS: negative for - chills, fatigue, fever, night sweats, weight gain or weight loss Psychological ROS: negative for - behavioral disorder, hallucinations, memory difficulties, mood swings or suicidal ideation Ophthalmic ROS: negative for - blurry vision, double vision, eye pain or loss of vision ENT ROS: negative for - epistaxis, nasal discharge, oral lesions, sore throat, tinnitus or vertigo Allergy and Immunology ROS: negative for - hives or itchy/watery eyes Hematological and Lymphatic ROS: negative for - bleeding problems, bruising or swollen lymph nodes Endocrine ROS: negative for - galactorrhea, hair pattern changes, polydipsia/polyuria or temperature intolerance Respiratory ROS: negative for - cough, hemoptysis, shortness of breath or wheezing Cardiovascular ROS: negative for - chest pain, dyspnea on exertion, edema or irregular heartbeat Gastrointestinal ROS: negative for - abdominal pain, diarrhea, hematemesis, nausea/vomiting or stool incontinence Genito-Urinary ROS: negative for - dysuria, hematuria, incontinence or urinary frequency/urgency Musculoskeletal ROS: negative for - joint swelling or muscular weakness Neurological ROS: as noted in HPI Dermatological ROS: negative for rash and skin lesion changes  Physical Examination: Blood pressure 175/107, pulse 86, resp. rate 16, last menstrual period 04/03/2015, SpO2 98 %.  HEENT-  Normocephalic, no lesions, without obvious abnormality.  Normal external eye and conjunctiva.  Normal TM's bilaterally.  Normal auditory  canals and external ears. Normal external nose, mucus membranes and septum.  Normal pharynx. Neck supple with no masses, nodes, nodules or enlargement. Cardiovascular - regular rate and rhythm, S1, S2 normal, no murmur, click, rub or  gallop Lungs - chest clear, no wheezing, rales, normal symmetric air entry Abdomen - soft, non-tender; bowel sounds normal; no masses,  no organomegaly Extremities - no joint deformities, effusion, or inflammation and no edema  Neurologic Examination: Mental Status: Alert, oriented, screaming at times with complaints of pain.  Speech fluent without evidence of aphasia. Able to follow commands without difficulty. Cranial Nerves: II-Visual fields were normal. III/IV/VI-Pupils were equal and reacted normally to light. Extraocular movements were full and conjugate.    V/VII-no facial numbness: Moderate left lower facial weakness (old). VIII-markedly impaired, particularly on the left X-normal speech. XI: trapezius strength/neck flexion strength normal bilaterally XII-midline tongue extension with normal strength. Motor: 5/5 bilaterally with normal tone and bulk Sensory: Normal throughout. Deep Tendon Reflexes: 2+ and symmetric. Plantars: Flexor bilaterally Carotid auscultation: Normal  Results for orders placed or performed during the hospital encounter of 05/10/15 (from the past 48 hour(s))  I-stat troponin, ED (not at [facility], [facility])     Status: None   Collection Time: 05/10/15  8:57 PM  Result Value Ref Range   Troponin i, poc 0.01 0.00 - 0.08 ng/mL   Comment 3            Comment: Due to the release kinetics of cTnI, a negative result within the first hours of the onset of symptoms does not rule out myocardial infarction with certainty. If myocardial infarction is still suspected, repeat the test at appropriate intervals.   I-Stat Chem 8, ED  (not at [facility], [facility])     Status: Abnormal   Collection Time: 05/10/15  8:59 PM  Result Value Ref Range   Sodium 137 135 - 145 mmol/L   Potassium 3.6 3.5 - 5.1 mmol/L   Chloride 100 (L) 101 - 111 mmol/L   BUN 19 6 - 20 mg/dL   Creatinine, Ser 0.80 0.44 - 1.00 mg/dL   Glucose, Bld 118 (H) 65 - 99 mg/dL   Calcium, Ion 1.01 (L) 1.12 - 1.23 mmol/L    TCO2 23 0 - 100 mmol/L   Hemoglobin 18.7 (H) 12.0 - 15.0 g/dL   HCT 55.0 (H) 36.0 - 46.0 %  Protime-INR     Status: None   Collection Time: 05/10/15  9:03 PM  Result Value Ref Range   Prothrombin Time 13.1 11.6 - 15.2 seconds   INR 0.97 0.00 - 1.49  APTT     Status: None   Collection Time: 05/10/15  9:03 PM  Result Value Ref Range   aPTT 33 24 - 37 seconds  CBC     Status: Abnormal   Collection Time: 05/10/15  9:03 PM  Result Value Ref Range   WBC 14.3 (H) 4.0 - 10.5 K/uL   RBC 5.56 (H) 3.87 - 5.11 MIL/uL   Hemoglobin 15.8 (H) 12.0 - 15.0 g/dL   HCT 47.3 (H) 36.0 - 46.0 %   MCV 85.1 78.0 - 100.0 fL   MCH 28.4 26.0 - 34.0 pg   MCHC 33.4 30.0 - 36.0 g/dL   RDW 13.6 11.5 - 15.5 %   Platelets 323 150 - 400 K/uL  Differential     Status: Abnormal   Collection Time: 05/10/15  9:03 PM  Result Value Ref Range   Neutrophils Relative % 73 %   Neutro Abs  10.4 (H) 1.7 - 7.7 K/uL   Lymphocytes Relative 20 %   Lymphs Abs 2.9 0.7 - 4.0 K/uL   Monocytes Relative 7 %   Monocytes Absolute 1.0 0.1 - 1.0 K/uL   Eosinophils Relative 0 %   Eosinophils Absolute 0.1 0.0 - 0.7 K/uL   Basophils Relative 0 %   Basophils Absolute 0.1 0.0 - 0.1 K/uL  Comprehensive metabolic panel     Status: Abnormal   Collection Time: 05/10/15  9:03 PM  Result Value Ref Range   Sodium 137 135 - 145 mmol/L   Potassium 3.8 3.5 - 5.1 mmol/L   Chloride 99 (L) 101 - 111 mmol/L   CO2 24 22 - 32 mmol/L   Glucose, Bld 119 (H) 65 - 99 mg/dL   BUN 16 6 - 20 mg/dL   Creatinine, Ser 0.86 0.44 - 1.00 mg/dL   Calcium 9.4 8.9 - 10.3 mg/dL   Total Protein 7.5 6.5 - 8.1 g/dL   Albumin 4.2 3.5 - 5.0 g/dL   AST 28 15 - 41 U/L   ALT 27 14 - 54 U/L   Alkaline Phosphatase 54 38 - 126 U/L   Total Bilirubin 0.6 0.3 - 1.2 mg/dL   GFR calc non Af Amer >60 >60 mL/min   GFR calc Af Amer >60 >60 mL/min    Comment: (NOTE) The eGFR has been calculated using the CKD EPI equation. This calculation has not been validated in all  clinical situations. eGFR's persistently <60 mL/min signify possible Chronic Kidney Disease.    Anion gap 14 5 - 15   Ct Head Wo Contrast  05/10/2015  CLINICAL DATA:  Code stroke. Right eye pressure and pain, with severe headache. Initial encounter. EXAM: CT HEAD WITHOUT CONTRAST TECHNIQUE: Contiguous axial images were obtained from the base of the skull through the vertex without intravenous contrast. COMPARISON:  None. FINDINGS: A 1.1 cm focus of increased attenuation at the peripheral right parietal lobe is thought to reflect an extra-axial meningioma, though a focus of hemorrhage cannot be entirely excluded. Mild periventricular and subcortical white matter change likely reflects small vessel ischemic microangiopathy. Small chronic lacunar infarcts are seen at the basal ganglia bilaterally. The brainstem and fourth ventricle are within normal limits. The basal ganglia are unremarkable in appearance. No midline shift is seen. There is no evidence of fracture; visualized osseous structures are unremarkable in appearance. The visualized portions of the orbits are within normal limits. The paranasal sinuses and mastoid air cells are well-aerated. No significant soft tissue abnormalities are seen. IMPRESSION: 1. 1.1 cm focus of increased attenuation at the peripheral right parietal lobe is thought to reflect an extra-axial meningioma, though a focus of hemorrhage cannot be entirely excluded. MRI of the brain is recommended for further evaluation. 2. Mild small vessel ischemic microangiopathy. Small chronic lacunar infarcts at the basal ganglia bilaterally. Electronically Signed   By: Garald Balding M.D.   On: 05/10/2015 21:14   Mr Brain Wo Contrast  05/10/2015  CLINICAL DATA:  Headache and hypertension beginning at 1630 hours today. Dizziness, lightheadedness and nausea. Code stroke. History of migraines, hyperlipidemia. Follow-up abnormal CT head. EXAM: MRI HEAD WITHOUT CONTRAST TECHNIQUE: Multiplanar,  multiecho pulse sequences of the brain and surrounding structures were obtained without intravenous contrast. COMPARISON:  CT HEAD May 10, 2015 at 2053 hours FINDINGS: INTRACRANIAL CONTENTS: No reduced diffusion to suggest acute ischemia. No susceptibility artifact to suggest hemorrhage. The ventricles and sulci are normal for patient's age. No mass lesions, mass effect. Old  Bilateral basal ganglia lacunar infarcts. FLAIR T2 hyperintensity within and about the basal ganglia. Patchy supratentorial white matter FLAIR T2 hyperintensities extending to internal capsule. Symmetric prominent basal ganglia perivascular spaces. No abnormal extra-axial fluid collections. Intermediate T1, low T2 13 x 14 x 10 mm (AP by CC by transverse) RIGHT frontal extra-axial mass. Normal major intracranial vascular flow voids present at skull base. ORBITS: The included ocular globes and orbital contents are non-suspicious. SINUSES: The mastoid air-cells and included paranasal sinuses are well-aerated. SKULL/SOFT TISSUES: Expanded partially empty sella. Generalized lobe T1 bone marrow signal. Craniocervical junction maintained. IMPRESSION: 13 x 14 x 10 mm RIGHT frontal meningioma. No intracranial hemorrhage. Bright signal about the basal ganglia can be seen with hypertensive encephalopathy. Moderate white matter changes advanced for age most compatible with chronic small vessel ischemic disease. Partially empty sella. Electronically Signed   By: Elon Alas M.D.   On: 05/10/2015 22:38    Assessment/Plan 50 year old lady with history of migraine headaches presenting with severe headache with associated nausea and left upper extremity sensory symptoms, likely manifestations of complex migraine headache. Patient has no clinical signs of acute stroke, and CT scan and MRI showed no acute intracranial abnormality.  Recommendations: 1. No further neurodiagnostic studies are indicated at this point. 2. Symptomatic management of  headache and nausea as needed.  C.R. Nicole Kindred, MD [facility] [phone]  05/11/2015, 12:21 AM

## 2015-05-13 ENCOUNTER — Ambulatory Visit (INDEPENDENT_AMBULATORY_CARE_PROVIDER_SITE_OTHER): Payer: Self-pay | Admitting: Family Medicine

## 2015-05-13 ENCOUNTER — Encounter: Payer: Self-pay | Admitting: Family Medicine

## 2015-05-13 VITALS — BP 140/80 | HR 98 | Wt 158.0 lb

## 2015-05-13 DIAGNOSIS — G43109 Migraine with aura, not intractable, without status migrainosus: Secondary | ICD-10-CM

## 2015-05-13 MED ORDER — SUMATRIPTAN SUCCINATE 100 MG PO TABS: ORAL_TABLET | ORAL | Status: DC

## 2015-05-13 NOTE — Progress Notes (Signed)
   Subjective:    Patient ID: Shari Baxter, female    DOB: Apr 28, 1965, 50 y.o.   MRN: [id number]  HPI She is here for follow-up visit after recent emergency room evaluation for sudden onset of headache. The urgent care and emergency room report was reviewed including scans and note from neurology. She does have a history of migraine headache with aura and after the evaluation the neurologist felt this was the main issue. Personally she is again having a headache that she describes is unilateral and throbbing with photophobia and phonophobia and nausea.She states she usually has approximately 4 days per month. She has has not treated these with a triptan.States that her or is usually a nausea feeling followed within 10 minutes by headache.   Review of Systems     Objective:   Physical Exam Alert and in no distress otherwise not examined       Assessment & Plan:  Migraine with aura and without status migrainosus, not intractable - Plan: SUMAtriptan (IMITREX) 100 MG tablet I'll make nasal spray given. Then give her Imitrex. Discussed treatment of a migraine with the onset of nausea. She is to use 2 Aleve and immediately also take Imitrex. She can repeat this in 2 hours. Since she is not having continual difficulty, we'll treat her episodically.

## 2015-05-13 NOTE — Patient Instructions (Addendum)
Given her Zomig and if you do not get relief in 2 hours take 1 Imitrex. He can also take 2 Aleve twice per day The next time you have that nausea take 2 Aleve and one Imitrex immediately

## 2015-08-11 ENCOUNTER — Other Ambulatory Visit (INDEPENDENT_AMBULATORY_CARE_PROVIDER_SITE_OTHER): Payer: Self-pay

## 2015-08-11 DIAGNOSIS — Z111 Encounter for screening for respiratory tuberculosis: Secondary | ICD-10-CM

## 2015-08-14 ENCOUNTER — Other Ambulatory Visit: Payer: Self-pay

## 2015-08-14 LAB — TB SKIN TEST: TB Skin Test: NEGATIVE

## 2015-08-14 NOTE — Progress Notes (Signed)
Here for TB test read and BP check.   TB test 32mm induration. BP in both arms today 130/70./rlb

## 2015-08-31 ENCOUNTER — Other Ambulatory Visit: Payer: Self-pay | Admitting: Family Medicine

## 2015-09-01 NOTE — Telephone Encounter (Signed)
Left message she needs to make a follow up appointment

## 2015-09-01 NOTE — Telephone Encounter (Signed)
Is this okay to refill? 

## 2015-09-01 NOTE — Telephone Encounter (Signed)
If her refill but make sure she is scheduled for a follow-up visit

## 2015-09-19 ENCOUNTER — Ambulatory Visit (INDEPENDENT_AMBULATORY_CARE_PROVIDER_SITE_OTHER): Payer: Self-pay | Admitting: Family Medicine

## 2015-09-19 VITALS — BP 140/94 | HR 68 | Ht 60.0 in | Wt 166.8 lb

## 2015-09-19 DIAGNOSIS — I10 Essential (primary) hypertension: Secondary | ICD-10-CM

## 2015-09-19 DIAGNOSIS — M25512 Pain in left shoulder: Secondary | ICD-10-CM

## 2015-09-19 DIAGNOSIS — F411 Generalized anxiety disorder: Secondary | ICD-10-CM

## 2015-09-19 MED ORDER — CITALOPRAM HYDROBROMIDE 20 MG PO TABS: 20.0000 mg | ORAL_TABLET | Freq: Every day | ORAL | 3 refills | Status: DC

## 2015-09-19 MED ORDER — RAMIPRIL 2.5 MG PO CAPS: 2.5000 mg | ORAL_CAPSULE | Freq: Every day | ORAL | 3 refills | Status: DC

## 2015-09-19 NOTE — Progress Notes (Signed)
   Subjective:    Patient ID: Shari Baxter, female    DOB: [birth year], 50 y.o.   MRN: [id number]  HPI She is here for an interval evaluation. She is on Altace and did stop her beta blocker on her own thinking she didn't need it anymore. She does have an underlying history of migraine headaches. She also continues on her Celexa stating it is working quite well to keep her mood stabilized. She is very happy with this. She also has a one-week history of left shoulder pain. She has a previous history of surgery on that shoulder 3 years ago and states that they cleaned it out. She cannot be more specific. She does not remember any injury or overuse to her shoulder. Motion does increase the pain but she also states the pain can be with sitting.   Review of Systems     Objective:   Physical Exam Alert and in no distress. Blood pressure is recorded. Full motion of the shoulder with some pain. Negative drop arm test. Supraspinatus testing normal. No laxity noted. Near's and Hawkins test was uncomfortable.       Assessment & Plan:  Essential hypertension - Plan: ramipril (ALTACE) 2.5 MG capsule  Left shoulder pain  Anxiety state - Plan: citalopram (CELEXA) 20 MG tablet I will renew her all taste but mention the fact that we need to recheck the blood pressure in 6 months and also if her headaches reemerge, we will need to place her back on the beta blocker for dual benefit. She will continue on Celexa since it is working well. Recommend more aggressive NSAID use. Also Tylenol. If continued difficulty, orthopedic referral will be made especially regard her previous surgery history.

## 2015-09-19 NOTE — Patient Instructions (Signed)
4 ibuprofen 3 times per day and you can also take Tylenol 2 tablets 4 times per day. If that doesn't work you can switch to Aleve which would be 2 tablets twice per day. If that doesn't work 56 to get an orthopedic surgeon involved

## 2015-11-28 ENCOUNTER — Other Ambulatory Visit: Payer: Self-pay | Admitting: Family Medicine

## 2015-11-28 MED ORDER — CITALOPRAM HYDROBROMIDE 20 MG PO TABS: 20.0000 mg | ORAL_TABLET | Freq: Every day | ORAL | 1 refills | Status: DC

## 2015-11-28 NOTE — Telephone Encounter (Signed)
Is this okay to refill? 

## 2015-11-28 NOTE — Addendum Note (Signed)
Addended by: Denita Lung on: 11/28/2015 12:44 PM   Modules accepted: Orders

## 2015-11-28 NOTE — Telephone Encounter (Signed)
This is Dr. Lalonde's 

## 2016-03-18 ENCOUNTER — Ambulatory Visit: Payer: Self-pay | Admitting: Family Medicine

## 2016-03-19 ENCOUNTER — Encounter: Payer: Self-pay | Admitting: Family Medicine

## 2016-04-30 ENCOUNTER — Ambulatory Visit (INDEPENDENT_AMBULATORY_CARE_PROVIDER_SITE_OTHER): Payer: Self-pay

## 2016-04-30 ENCOUNTER — Ambulatory Visit (INDEPENDENT_AMBULATORY_CARE_PROVIDER_SITE_OTHER): Payer: Self-pay | Admitting: Podiatry

## 2016-04-30 ENCOUNTER — Encounter: Payer: Self-pay | Admitting: Podiatry

## 2016-04-30 DIAGNOSIS — M779 Enthesopathy, unspecified: Secondary | ICD-10-CM

## 2016-04-30 DIAGNOSIS — S92355A Nondisplaced fracture of fifth metatarsal bone, left foot, initial encounter for closed fracture: Secondary | ICD-10-CM

## 2016-04-30 DIAGNOSIS — M79672 Pain in left foot: Secondary | ICD-10-CM

## 2016-04-30 MED ORDER — METHYLPREDNISOLONE 4 MG PO TBPK: ORAL_TABLET | ORAL | 0 refills | Status: DC

## 2016-04-30 MED ORDER — MELOXICAM 15 MG PO TABS: 15.0000 mg | ORAL_TABLET | Freq: Every day | ORAL | 2 refills | Status: DC

## 2016-04-30 NOTE — Patient Instructions (Signed)
Start with the medrol dose pack (steroid). Once complete, you can start the meloxicam (mobic)

## 2016-05-03 NOTE — Progress Notes (Signed)
Subjective: 51 year old female presents the office they with a new concern of pain to the outside aspect the left foot and she points the fifth metatarsal base. She states that the pain should off intermittent but today she states the pain has gotten much worse and she has difficulty putting weight on her foot. She states that she has noticed swelling to this area. She denies any recent injury or trauma to the area that she can recall. She said no recent treatment for this but she is limping because of pain. Denies any systemic complaints such as fevers, chills, nausea, vomiting. No acute changes since last appointment, and no other complaints at this time.   Objective: AAO x3, NAD DP/PT pulses palpable bilaterally, CRT less than 3 seconds There is tenderness palpation of the fifth metatarsal base and the left footpain vibratory sensation is very. There is moderate edema to this area is no erythema or increase in warmth. There is also tenderness along the distal portion the peroneal tendon along the insertion of the fifth metatarsal base. The peroneal tendon appears to be intact. There is no other areas of tenderness and for this time. No open lesions or pre-ulcerative lesions.  No pain with calf compression, swelling, warmth, erythema  Assessment: 51 year old female with insertional left peroneal tendinitis, likely small avulsion fracture  Plan: -All treatment options discussed with the patient including all alternatives, risks, complications.  -X rays were obtained and reviewed. Small avulsion fracture present fifth metatarsal base. No old x-rays to convert this to but given the appearance this appears to be acute. -Place into a cam walker today. -Prescribed a Medrol Dosepak once this was complete she can start meloxicam and discussed side effects of each. -Ice elevation limited activity. -RTC 2 weeks or sooner if needed. -Patient encouraged to call the office with any questions, concerns,  change in symptoms.   Celesta Gentile, DPM

## 2016-05-03 NOTE — Progress Notes (Signed)
This is your patient but somehow got routed to me, listed me as referring.

## 2016-05-10 ENCOUNTER — Encounter: Payer: Self-pay | Admitting: Podiatry

## 2016-05-10 ENCOUNTER — Ambulatory Visit (INDEPENDENT_AMBULATORY_CARE_PROVIDER_SITE_OTHER): Payer: Self-pay

## 2016-05-10 ENCOUNTER — Ambulatory Visit (INDEPENDENT_AMBULATORY_CARE_PROVIDER_SITE_OTHER): Payer: Self-pay | Admitting: Podiatry

## 2016-05-10 DIAGNOSIS — S92355D Nondisplaced fracture of fifth metatarsal bone, left foot, subsequent encounter for fracture with routine healing: Secondary | ICD-10-CM

## 2016-05-10 DIAGNOSIS — M779 Enthesopathy, unspecified: Secondary | ICD-10-CM

## 2016-05-10 DIAGNOSIS — R52 Pain, unspecified: Secondary | ICD-10-CM

## 2016-05-12 NOTE — Progress Notes (Signed)
Subjective: Shari Baxter presents to the office today for follow-up evaluation of tendonitis and small avulsion fracture of the left 5th metatarsal base. She states that she is doing better but she still gets some swelling and pain to the area but is improved. She occasionally gets some burning to the area as well. She has remained in the CAM boot.  Denies any systemic complaints such as fevers, chills, nausea, vomiting. No acute changes since last appointment, and no other complaints at this time.   Objective: AAO x3, NAD DP/PT pulses palpable bilaterally, CRT less than 3 seconds There is continued but much improved tenderness and swelling to the 5th metatarsal base. There is tenderness along the distal portion of the peroneal tendon proximal to the 5th metatarsal base as well. The tendon appears intact. No erythema or increase in warmth. No other areas of tenderness.  No open lesions or pre-ulcerative lesions.  No pain with calf compression, swelling, warmth, erythema  Assessment: 51 year old female with left foot peroneal tendonitis; avulsion fracture 5th metatarsal base (tendon seems to be intact)  Plan: -All treatment options discussed with the patient including all alternatives, risks, complications.  -X-rays were obtained and reviewed with the patient. Small avulsion still present improved. No other areas of acute fracture noted at this time.  -Discussed steroid injection and she wishes to proceed. A steroid injection was carried out today with a mix of dexamethasone and local anesthetic along the peroneal tendon but care taken not to infiltrate directly into the tendon.  -Continue CAM boot for the rest of this week. After that she can try to transition to a regular shoe as able.  -Continue to ice and limit activity.  -Mobic prn -Patient encouraged to call the office with any questions, concerns, change in symptoms.   Celesta Gentile, DPM

## 2016-05-20 ENCOUNTER — Ambulatory Visit: Payer: Self-pay | Admitting: Podiatry

## 2016-11-14 ENCOUNTER — Other Ambulatory Visit: Payer: Self-pay | Admitting: Family Medicine

## 2016-11-14 DIAGNOSIS — I10 Essential (primary) hypertension: Secondary | ICD-10-CM

## 2016-11-15 NOTE — Telephone Encounter (Signed)
LM on VCM that we can only renew for 30 days and pt needs appt. Victorino December

## 2016-12-08 ENCOUNTER — Other Ambulatory Visit: Payer: Self-pay | Admitting: Family Medicine

## 2016-12-08 NOTE — Telephone Encounter (Signed)
It is time for a follow-up appointment.  Do not let her run out

## 2016-12-09 NOTE — Telephone Encounter (Signed)
LM to schedule. Victorino December

## 2017-01-10 ENCOUNTER — Telehealth: Payer: Self-pay | Admitting: Family Medicine

## 2017-01-10 DIAGNOSIS — I10 Essential (primary) hypertension: Secondary | ICD-10-CM

## 2017-01-10 NOTE — Telephone Encounter (Signed)
Rcvd refill request for Citalopram 20 mg #90 & Ramipril 2.5 mg #90

## 2017-01-11 MED ORDER — CITALOPRAM HYDROBROMIDE 20 MG PO TABS: 20.0000 mg | ORAL_TABLET | Freq: Every day | ORAL | 1 refills | Status: DC

## 2017-01-11 MED ORDER — RAMIPRIL 2.5 MG PO CAPS: 2.5000 mg | ORAL_CAPSULE | Freq: Every day | ORAL | 1 refills | Status: DC

## 2017-02-14 ENCOUNTER — Ambulatory Visit (INDEPENDENT_AMBULATORY_CARE_PROVIDER_SITE_OTHER): Payer: Self-pay | Admitting: Family Medicine

## 2017-02-14 ENCOUNTER — Encounter: Payer: Self-pay | Admitting: Family Medicine

## 2017-02-14 VITALS — BP 130/90 | HR 60 | Temp 97.9°F

## 2017-02-14 DIAGNOSIS — I10 Essential (primary) hypertension: Secondary | ICD-10-CM

## 2017-02-14 DIAGNOSIS — Z8673 Personal history of transient ischemic attack (TIA), and cerebral infarction without residual deficits: Secondary | ICD-10-CM

## 2017-02-14 DIAGNOSIS — G43109 Migraine with aura, not intractable, without status migrainosus: Secondary | ICD-10-CM

## 2017-02-14 DIAGNOSIS — R519 Headache, unspecified: Secondary | ICD-10-CM

## 2017-02-14 DIAGNOSIS — R51 Headache: Secondary | ICD-10-CM

## 2017-02-14 DIAGNOSIS — R110 Nausea: Secondary | ICD-10-CM

## 2017-02-14 MED ORDER — PROMETHAZINE HCL 25 MG PO TABS: 25.0000 mg | ORAL_TABLET | Freq: Three times a day (TID) | ORAL | 0 refills | Status: AC | PRN

## 2017-02-14 MED ORDER — KETOROLAC TROMETHAMINE 60 MG/2ML IM SOLN
60.0000 mg | Freq: Once | INTRAMUSCULAR | Status: AC
  Administered 2017-02-14: 60 mg via INTRAMUSCULAR

## 2017-02-14 MED ORDER — SUMATRIPTAN SUCCINATE 100 MG PO TABS: ORAL_TABLET | ORAL | 0 refills | Status: AC

## 2017-02-14 NOTE — Progress Notes (Signed)
Subjective:    Patient ID: Shari Baxter, female    DOB: 04-07-1965, 52 y.o.   MRN: [id number]  HPI Chief Complaint  Patient presents with  . nausea    nausea, shaky feeling on left side, no dizziness but having a migraine. feeling like she is going to throw up. started this morning   She walked into the practice to be seen for complaints of left frontal headache that started at 8:30am. Pain is non radiating, constant and sharp. Prior to 8:30 am she reports feeling at her usual state of health.  Also complains of photosensitivity and nausea. States pain is 10/10 but is not the worst headache she has ever had. No aura.   Reports history of migraine headaches and states this feels like one of her usual headaches. Last migraine headache was 6 months ago per patient. States she had to go to the ED at that time and they gave her a migraine cocktail and sent her home.   States in the past she has been able to treat her headaches with aspirin, Imitrex and Phenergan. States she is out of Imitrex and Phenergan.   Denies memory issues, speech difficulties, double or blurred vision, focal weakness.  Denies fever, chills, dizziness, chest pain, palpitations, shortness of breath, abdominal pain, vomiting, diarrhea, urinary symptoms.   States she ran out of BP medication 3 days ago. States she has a refill at the pharmacy but needs to go get it.   Reports history of ?TIA in [place] 4 years ago. Wears hearing aids.   Postmenopausal. No period for one year.   Reviewed allergies, medications, past medical, surgical, family, and social history.   Review of Systems Pertinent positives and negatives in the history of present illness.     Objective:   Physical Exam  Constitutional: She is oriented to person, place, and time. She appears well-developed and well-nourished. No distress.  HENT:  Right Ear: Tympanic membrane and ear canal normal.  Left Ear: Tympanic membrane and ear canal normal.    Nose: Nose normal. Right sinus exhibits no maxillary sinus tenderness and no frontal sinus tenderness. Left sinus exhibits no maxillary sinus tenderness and no frontal sinus tenderness.  Mouth/Throat: Uvula is midline, oropharynx is clear and moist and mucous membranes are normal.  Hearing aid in left ear  Eyes: Conjunctivae, EOM and lids are normal. Pupils are equal, round, and reactive to light.  Neck: Trachea normal and normal range of motion. Neck supple. No JVD present.  Cardiovascular: Normal rate, regular rhythm, normal heart sounds and normal pulses. Exam reveals no gallop.  No murmur heard. No LE edema   Pulmonary/Chest: Effort normal and breath sounds normal.  Musculoskeletal: Normal range of motion.  Lymphadenopathy:    She has no cervical adenopathy.  Neurological: She is alert and oriented to person, place, and time. She has normal strength and normal reflexes. No cranial nerve deficit or sensory deficit. She displays a negative Romberg sign. Coordination and gait normal.  No facial asymmetry, normal finger to nose, heel to shin.   Skin: Skin is warm and dry. No rash noted. No pallor.  Psychiatric: She has a normal mood and affect. Her speech is normal and behavior is normal. Thought content normal. Cognition and memory are normal.   BP 130/90   Pulse 60   Temp 97.9 F (36.6 C) (Tympanic)       Assessment & Plan:  Acute intractable headache, unspecified headache type - Plan: CBC with Differential/Platelet, Comprehensive metabolic  panel, TSH  Essential hypertension - Plan: CBC with Differential/Platelet, Comprehensive metabolic panel, TSH  History of CVA (cerebrovascular accident)  Migraine with aura and without status migrainosus, not intractable - Plan: SUMAtriptan (IMITREX) 100 MG tablet, ketorolac (TORADOL) injection 60 mg  Nausea - Plan: promethazine (PHENERGAN) 25 MG tablet  Repeat BP prior to departure 130/88. Reviewed previous migraine headache history as  well as recent CT head and MRI brain.  Normal neuro exam. No sign of an acute neurological event.  Toradol 60 mg injection in office. She reported improvement in pain 15-20 minutes after injection.  Refilled Sumatriptan.  Prescribed Phenergan. She did not have a driver with her today.  Advised her to go home, rest and take medications as prescribed.  She will start back on BP medication.  Follow up if not back to baseline. If she notices any neurological symptoms, she will call 911 or go to the closest ED.

## 2017-02-15 ENCOUNTER — Encounter (HOSPITAL_COMMUNITY): Payer: Self-pay | Admitting: Nurse Practitioner

## 2017-02-15 ENCOUNTER — Emergency Department (HOSPITAL_COMMUNITY): Payer: Self-pay

## 2017-02-15 ENCOUNTER — Emergency Department (HOSPITAL_COMMUNITY)
Admission: EM | Admit: 2017-02-15 | Discharge: 2017-02-15 | Disposition: A | Discharge status: Home / Self Care | Payer: Self-pay | Attending: Emergency Medicine | Admitting: Emergency Medicine

## 2017-02-15 DIAGNOSIS — G43819 Other migraine, intractable, without status migrainosus: Secondary | ICD-10-CM | POA: Insufficient documentation

## 2017-02-15 DIAGNOSIS — Z79899 Other long term (current) drug therapy: Secondary | ICD-10-CM | POA: Insufficient documentation

## 2017-02-15 DIAGNOSIS — G43019 Migraine without aura, intractable, without status migrainosus: Secondary | ICD-10-CM

## 2017-02-15 DIAGNOSIS — I10 Essential (primary) hypertension: Secondary | ICD-10-CM | POA: Insufficient documentation

## 2017-02-15 HISTORY — DX: Essential (primary) hypertension: I10

## 2017-02-15 LAB — COMPREHENSIVE METABOLIC PANEL
A/G RATIO: 1.6 (ref 1.2–2.2)
ALT: 15 IU/L (ref 0–32)
AST: 19 IU/L (ref 0–40)
Albumin: 4.7 g/dL (ref 3.5–5.5)
Alkaline Phosphatase: 65 IU/L (ref 39–117)
BILIRUBIN TOTAL: 0.2 mg/dL (ref 0.0–1.2)
BUN/Creatinine Ratio: 14 (ref 9–23)
BUN: 14 mg/dL (ref 6–24)
CHLORIDE: 100 mmol/L (ref 96–106)
CO2: 21 mmol/L (ref 20–29)
Calcium: 9.6 mg/dL (ref 8.7–10.2)
Creatinine, Ser: 0.99 mg/dL (ref 0.57–1.00)
GFR calc Af Amer: 76 mL/min/{1.73_m2} (ref >59)
GFR, EST NON AFRICAN AMERICAN: 66 mL/min/{1.73_m2} (ref >59)
GLOBULIN, TOTAL: 3 g/dL (ref 1.5–4.5)
Glucose: 102 mg/dL — ABNORMAL HIGH (ref 65–99)
POTASSIUM: 4.9 mmol/L (ref 3.5–5.2)
SODIUM: 141 mmol/L (ref 134–144)
Total Protein: 7.7 g/dL (ref 6.0–8.5)

## 2017-02-15 LAB — CBC WITH DIFFERENTIAL/PLATELET
BASOS: 0 %
Basophils Absolute: 0 10*3/uL (ref 0.0–0.2)
EOS (ABSOLUTE): 0.1 10*3/uL (ref 0.0–0.4)
EOS: 1 %
HEMATOCRIT: 51.2 % — AB (ref 34.0–46.6)
Hemoglobin: 16.4 g/dL — ABNORMAL HIGH (ref 11.1–15.9)
Immature Grans (Abs): 0 10*3/uL (ref 0.0–0.1)
Immature Granulocytes: 0 %
LYMPHS ABS: 2.4 10*3/uL (ref 0.7–3.1)
Lymphs: 22 %
MCH: 28.9 pg (ref 26.6–33.0)
MCHC: 32 g/dL (ref 31.5–35.7)
MCV: 90 fL (ref 79–97)
MONOS ABS: 0.5 10*3/uL (ref 0.1–0.9)
Monocytes: 5 %
NEUTROS ABS: 7.9 10*3/uL — AB (ref 1.4–7.0)
NEUTROS PCT: 72 %
PLATELETS: 295 10*3/uL (ref 150–379)
RBC: 5.67 x10E6/uL — ABNORMAL HIGH (ref 3.77–5.28)
RDW: 14.7 % (ref 12.3–15.4)
WBC: 10.9 10*3/uL — ABNORMAL HIGH (ref 3.4–10.8)

## 2017-02-15 LAB — URINALYSIS, ROUTINE W REFLEX MICROSCOPIC
BILIRUBIN URINE: NEGATIVE
Glucose, UA: NEGATIVE mg/dL
KETONES UR: 5 mg/dL — AB
LEUKOCYTES UA: NEGATIVE
Nitrite: NEGATIVE
PH: 6 (ref 5.0–8.0)
Protein, ur: NEGATIVE mg/dL
Specific Gravity, Urine: 1.013 (ref 1.005–1.030)

## 2017-02-15 LAB — TSH: TSH: 2.76 u[IU]/mL (ref 0.450–4.500)

## 2017-02-15 MED ORDER — HYDROMORPHONE HCL 1 MG/ML IJ SOLN
1.0000 mg | Freq: Once | INTRAMUSCULAR | Status: AC
  Administered 2017-02-15: 1 mg via INTRAVENOUS
  Filled 2017-02-15: qty 1

## 2017-02-15 MED ORDER — KETOROLAC TROMETHAMINE 30 MG/ML IJ SOLN
30.0000 mg | Freq: Once | INTRAMUSCULAR | Status: AC
  Administered 2017-02-15: 30 mg via INTRAVENOUS
  Filled 2017-02-15: qty 1

## 2017-02-15 MED ORDER — LORAZEPAM 2 MG/ML IJ SOLN
1.0000 mg | Freq: Once | INTRAMUSCULAR | Status: AC
  Administered 2017-02-15: 1 mg via INTRAVENOUS
  Filled 2017-02-15: qty 1

## 2017-02-15 MED ORDER — ACETAMINOPHEN 500 MG PO TABS
1000.0000 mg | ORAL_TABLET | Freq: Once | ORAL | Status: AC
  Administered 2017-02-15: 1000 mg via ORAL
  Filled 2017-02-15: qty 2

## 2017-02-15 MED ORDER — ONDANSETRON HCL 4 MG/2ML IJ SOLN
4.0000 mg | Freq: Once | INTRAMUSCULAR | Status: AC
  Administered 2017-02-15: 4 mg via INTRAVENOUS
  Filled 2017-02-15: qty 2

## 2017-02-15 MED ORDER — SODIUM CHLORIDE 0.9 % IV BOLUS (SEPSIS)
1000.0000 mL | Freq: Once | INTRAVENOUS | Status: AC
  Administered 2017-02-15: 1000 mL via INTRAVENOUS

## 2017-02-15 MED ORDER — METOCLOPRAMIDE HCL 5 MG/ML IJ SOLN
10.0000 mg | Freq: Once | INTRAMUSCULAR | Status: AC
  Administered 2017-02-15: 10 mg via INTRAVENOUS
  Filled 2017-02-15: qty 2

## 2017-02-15 MED ORDER — SODIUM CHLORIDE 0.9 % IV BOLUS (SEPSIS)
500.0000 mL | Freq: Once | INTRAVENOUS | Status: AC
  Administered 2017-02-15: 500 mL via INTRAVENOUS

## 2017-02-15 MED ORDER — PROMETHAZINE HCL 25 MG/ML IJ SOLN
12.5000 mg | Freq: Once | INTRAMUSCULAR | Status: AC
  Administered 2017-02-15: 12.5 mg via INTRAVENOUS
  Filled 2017-02-15: qty 1

## 2017-02-15 MED ORDER — TRAMADOL HCL 50 MG PO TABS: 50.0000 mg | ORAL_TABLET | Freq: Once | ORAL | Status: DC

## 2017-02-15 NOTE — ED Notes (Signed)
PT ambulated to restroom with 2 staff assistance. She was shaky and reports "hurting all over and needs to lie down." RN got her water and HOB is upright. Encouraged to drink water.

## 2017-02-15 NOTE — ED Provider Notes (Signed)
  Patient turned over for evaluation of her urinalysis and chest x-ray.  Both without acute findings.  Headache is persisted.  Patient given some hydromorphone and given some Phenergan.  Slight improvement.  Symptoms possibly could be related to a viral type illness.  The game plan was that if these were negative patient could be discharged home.  Patient's vital signs show evidence of improvement no longer tachycardic blood pressure is good oxygen saturations on room air 95%.   Fredia Sorrow, MD 02/15/17 2104

## 2017-02-15 NOTE — ED Notes (Signed)
Pt states she still feels "terrible" and is complaining of a severe headache, EDP made aware.

## 2017-02-15 NOTE — ED Notes (Signed)
Pt had come out of bed and was naked at doorway; was redirected to bed.

## 2017-02-15 NOTE — ED Provider Notes (Addendum)
[facility] EMERGENCY DEPARTMENT Provider Note   CSN: [id number] Arrival date & time: 02/15/17  1030     History   Chief Complaint Chief Complaint  Patient presents with  . Headache    HPI Shari Baxter is a 52 y.o. female.  Patient w hx migraines, c/o headache this AM. Headache onset onset, persistent, moderate-severe. Pt indicates also had headache yesterday for which she saw her doctor.  Pt with nausea/vomiting. Emesis clear, not bloody or bilious. Pt is very anxious, poor historian - level 5 caveat. Patient w prior ED visit for c/o severe headache in past. Denies trauma/fall. No fever.    The history is provided by the patient and the EMS personnel. The history is limited by the condition of the patient.  Headache   Associated symptoms include nausea and vomiting. Pertinent negatives include no fever.    Past Medical History:  Diagnosis Date  . ADD (attention deficit disorder)   . Allergy    seasonal  . Anxiety   . Bursitis    left arm  . Depression    history  . Hearing loss    bilateral - wears hearing aids  . Hyperlipidemia    no meds, diet controlled  . Migraines   . Stroke [facility]) 2012   some left sided weakness    Patient Active Problem List   Diagnosis Date Noted  . Essential hypertension 05/21/2014  . OAB (overactive bladder) 05/21/2014  . History of abnormal cervical Pap smear 05/21/2014  . ADD (attention deficit disorder) 05/21/2014  . History of CVA (cerebrovascular accident) 05/21/2014  . Bilateral sensorineural hearing loss 05/21/2014  . Anxiety state 05/21/2014  . Allergic rhinitis 05/21/2014    Past Surgical History:  Procedure Laterality Date  . CHOLECYSTECTOMY    . PECTUS EXCAVATUM REPAIR N/A age 76-1/2  . SPLENECTOMY N/A   . TONSILLECTOMY    . WISDOM TOOTH EXTRACTION      OB History    No data available       Home Medications    Prior to Admission medications   Medication Sig Start Date End Date Taking?  Authorizing Provider  citalopram (CELEXA) 20 MG tablet Take 1 tablet (20 mg total) by mouth daily. 01/11/17   Denita Lung, MD  fexofenadine-pseudoephedrine (ALLEGRA-D 24) 180-240 MG per 24 hr tablet Take 1 tablet by mouth daily as needed.    [provider]  promethazine (PHENERGAN) 25 MG tablet Take 1 tablet (25 mg total) by mouth every 8 (eight) hours as needed for nausea or vomiting. 02/14/17   Henson, Vickie L, NP-C  ramipril (ALTACE) 2.5 MG capsule Take 1 capsule (2.5 mg total) by mouth daily. 01/11/17   Denita Lung, MD  SUMAtriptan (IMITREX) 100 MG tablet May repeat in 2 hours if headache persists or recurs.Not to exceed 2 pills per day 02/14/17   Girtha Rm, NP-C    Family History Family History  Problem Relation Age of Onset  . Colon cancer Neg Hx     Social History Social History   Tobacco Use  . Smoking status: Never Smoker  . Smokeless tobacco: Never Used  . Tobacco comment: husband smokes  Substance Use Topics  . Alcohol use: Yes    Alcohol/week: 0.0 oz    Comment: socially  . Drug use: No     Allergies   Sulfa antibiotics   Review of Systems Review of Systems  Unable to perform ROS: Other  Constitutional: Negative for fever.  Gastrointestinal: Positive for nausea and vomiting.  Neurological: Positive for headaches. Negative for weakness and numbness.  level 5 caveat - pt v anxious and poorly responsive to questions.      Physical Exam Updated Vital Signs BP (!) 158/113   Pulse 89   Temp (!) 97.3 F (36.3 C) (Axillary)   Resp 18   SpO2 99%   Physical Exam  Constitutional: She appears well-developed and well-nourished. No distress.  Very anxious, calling out, flailing bil arms and legs about on stretcher.   HENT:  Head: Atraumatic.  Nose: Nose normal.  Mouth/Throat: Oropharynx is clear and moist.  No sinus or temporal tenderness.  Eyes: Conjunctivae and EOM are normal. Pupils are equal, round, and reactive to light. No scleral  icterus.  Neck: Neck supple. No tracheal deviation present. No thyromegaly present.  No stiffness or rigidity.   Cardiovascular: Normal rate, regular rhythm, normal heart sounds and intact distal pulses. Exam reveals no gallop and no friction rub.  No murmur heard. Pulmonary/Chest: Effort normal and breath sounds normal. No respiratory distress.  Abdominal: Soft. Normal appearance and bowel sounds are normal. She exhibits no distension. There is no tenderness.  Genitourinary:  Genitourinary Comments: No cva tenderness.  Musculoskeletal: Normal range of motion. She exhibits no edema or tenderness.  Neurological: She is alert. No cranial nerve deficit.  Motor intact bilaterally. stre 5/5. sens grossly intact. Speech fluent.   Skin: Skin is warm and dry. No rash noted. She is not diaphoretic.  Psychiatric:  Very anxious.   Nursing note and vitals reviewed.    ED Treatments / Results  Labs (all labs ordered are listed, but only abnormal results are displayed) Results for orders placed or performed in visit on 02/14/17  CBC with Differential/Platelet  Result Value Ref Range   WBC 10.9 (H) 3.4 - 10.8 x10E3/uL   RBC 5.67 (H) 3.77 - 5.28 x10E6/uL   Hemoglobin 16.4 (H) 11.1 - 15.9 g/dL   Hematocrit 51.2 (H) 34.0 - 46.6 %   MCV 90 79 - 97 fL   MCH 28.9 26.6 - 33.0 pg   MCHC 32.0 31.5 - 35.7 g/dL   RDW 14.7 12.3 - 15.4 %   Platelets 295 150 - 379 x10E3/uL   Neutrophils 72 Not Estab. %   Lymphs 22 Not Estab. %   Monocytes 5 Not Estab. %   Eos 1 Not Estab. %   Basos 0 Not Estab. %   Neutrophils Absolute 7.9 (H) 1.4 - 7.0 x10E3/uL   Lymphocytes Absolute 2.4 0.7 - 3.1 x10E3/uL   Monocytes Absolute 0.5 0.1 - 0.9 x10E3/uL   EOS (ABSOLUTE) 0.1 0.0 - 0.4 x10E3/uL   Basophils Absolute 0.0 0.0 - 0.2 x10E3/uL   Immature Granulocytes 0 Not Estab. %   Immature Grans (Abs) 0.0 0.0 - 0.1 x10E3/uL  Comprehensive metabolic panel  Result Value Ref Range   Glucose 102 (H) 65 - 99 mg/dL   BUN 14 6  - 24 mg/dL   Creatinine, Ser 0.99 0.57 - 1.00 mg/dL   GFR calc non Af Amer 66 >59 mL/min/1.73   GFR calc Af Amer 76 >59 mL/min/1.73   BUN/Creatinine Ratio 14 9 - 23   Sodium 141 134 - 144 mmol/L   Potassium 4.9 3.5 - 5.2 mmol/L   Chloride 100 96 - 106 mmol/L   CO2 21 20 - 29 mmol/L   Calcium 9.6 8.7 - 10.2 mg/dL   Total Protein 7.7 6.0 - 8.5 g/dL   Albumin 4.7 3.5 -  5.5 g/dL   Globulin, Total 3.0 1.5 - 4.5 g/dL   Albumin/Globulin Ratio 1.6 1.2 - 2.2   Bilirubin Total 0.2 0.0 - 1.2 mg/dL   Alkaline Phosphatase 65 39 - 117 IU/L   AST 19 0 - 40 IU/L   ALT 15 0 - 32 IU/L  TSH  Result Value Ref Range   TSH 2.760 0.450 - 4.500 uIU/mL   Ct Head Wo Contrast  Result Date: 02/15/2017 CLINICAL DATA:  Headache, primarily left-sided EXAM: CT HEAD WITHOUT CONTRAST TECHNIQUE: Contiguous axial images were obtained from the base of the skull through the vertex without intravenous contrast. COMPARISON:  Head CT and brain MRI May 10, 2015 FINDINGS: Note that there is a degree of motion artifact. Brain: The ventricles are normal in size and configuration. 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 There is a stable focal meningioma along the dura of the posterior right frontal region, measuring 1.3 x 1.2 x 0.9 cm, stable. There is no surrounding edema, and there is no appreciable mass effect. No other mass is evident. There is no evident hemorrhage, extra-axial fluid collection, or midline shift. There is evidence of a prior infarct at the genu of the right internal capsule. There is small vessel disease in each external capsule anteriorly as well as in the anterior limb of the right internal capsule. There is nearby small vessel disease in the anterior centra semiovale bilaterally. Elsewhere gray-white compartments appear normal. No acute infarct evident. Vascular: There is no appreciable hyperdense vessel. There is calcification in each carotid siphon. Skull: Bony calvarium appears intact.  Sinuses/Orbits: There is mucosal thickening in several ethmoid air cells bilaterally. Other visualized paranasal sinuses are clear. Orbits appear symmetric bilaterally. Other: Mastoid air cells are clear. IMPRESSION: 1.  There is a degree of motion artifact. 2. Stable meningioma posterior right frontal region without surrounding mass effect or edema. No new mass. 3. Prior infarcts in the basal ganglia regions bilaterally, more on the right than the left. There is mild small vessel disease in the anterior centra semiovale bilaterally. No acute infarct evident. No hemorrhage or extra-axial fluid collection. 4.  There are foci of arterial vascular calcification. 5.  There is mucosal thickening in several ethmoid air cells. Electronically Signed   By: Lowella Grip III M.D.   On: 02/15/2017 11:48    EKG  EKG Interpretation None       Radiology Ct Head Wo Contrast  Result Date: 02/15/2017 CLINICAL DATA:  Headache, primarily left-sided EXAM: CT HEAD WITHOUT CONTRAST TECHNIQUE: Contiguous axial images were obtained from the base of the skull through the vertex without intravenous contrast. COMPARISON:  Head CT and brain MRI May 10, 2015 FINDINGS: Note that there is a degree of motion artifact. Brain: The ventricles are normal in size and configuration. 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 There is a stable focal meningioma along the dura of the posterior right frontal region, measuring 1.3 x 1.2 x 0.9 cm, stable. There is no surrounding edema, and there is no appreciable mass effect. No other mass is evident. There is no evident hemorrhage, extra-axial fluid collection, or midline shift. There is evidence of a prior infarct at the genu of the right internal capsule. There is small vessel disease in each external capsule anteriorly as well as in the anterior limb of the right internal capsule. There is nearby small vessel disease in the anterior centra semiovale bilaterally.  Elsewhere gray-white compartments appear normal.  No acute infarct evident. Vascular: There is no appreciable hyperdense vessel. There is calcification in each carotid siphon. Skull: Bony calvarium appears intact. Sinuses/Orbits: There is mucosal thickening in several ethmoid air cells bilaterally. Other visualized paranasal sinuses are clear. Orbits appear symmetric bilaterally. Other: Mastoid air cells are clear. IMPRESSION: 1.  There is a degree of motion artifact. 2. Stable meningioma posterior right frontal region without surrounding mass effect or edema. No new mass. 3. Prior infarcts in the basal ganglia regions bilaterally, more on the right than the left. There is mild small vessel disease in the anterior centra semiovale bilaterally. No acute infarct evident. No hemorrhage or extra-axial fluid collection. 4.  There are foci of arterial vascular calcification. 5.  There is mucosal thickening in several ethmoid air cells. Electronically Signed   By: Lowella Grip III M.D.   On: 02/15/2017 11:48    Procedures Procedures (including critical care time)  Medications Ordered in ED Medications  sodium chloride 0.9 % bolus 500 mL (not administered)  metoCLOPramide (REGLAN) injection 10 mg (not administered)     Initial Impression / Assessment and Plan / ED Course  I have reviewed the triage vital signs and the nursing notes.  Pertinent labs & imaging results that were available during my care of the patient were reviewed by me and considered in my medical decision making (see chart for details).  Patient extremely anxious. Reassurance provided.   Reviewed nursing notes and prior charts for additional history.   reglan iv for migraine and nausea.  Given pts mental status/odd behavior, will get imaging study.   Ct neg acute.   Pt calmer, alert. States headache improved, but not resolved. Additional med given.  Po fluids. Snack.  Pt comfortable appearing. Pt currently appears stable  for d/c.   On recheck pt c/o muscle/body aches, subj fever, congestion.  On exam, no neck stiffnes or rigidity, chest cta.  ?possible viral illness or flu.  Pt feels needs fluids.   Iv ns bolus. zofran iv.  Anticipate will be able to go home post ivf/meds - signed out to Dr Rogene Houston.     Final Clinical Impressions(s) / ED Diagnoses   Final diagnoses:  None    ED Discharge Orders    None       Lajean Saver, MD 02/15/17 1438    Lajean Saver, MD 02/15/17 [phone]

## 2017-02-15 NOTE — ED Notes (Signed)
MD informed about pt dispo and he will come reassess.

## 2017-02-15 NOTE — ED Triage Notes (Signed)
Pt c/o worst headache ever started last night. Pt endorses associated nausea and vomiting. Pt very agitated upon EMS arrival not answering questions repeatedly stating "somebody help me please" unable to reorient flailing all extremities. No obviou  Neuro deficits noted- pt unable to cooperate with full neuro exam.

## 2017-02-15 NOTE — ED Notes (Signed)
Pt ambulatory to the restroom with 1 assist

## 2017-02-15 NOTE — ED Notes (Signed)
Pt resting. Call husband when ready for discharge.

## 2017-02-15 NOTE — ED Notes (Signed)
Pt resting; back from Ct. O@ sats declined to 86%. Nasal cannula 2 L placed

## 2017-02-15 NOTE — Discharge Instructions (Signed)
It was our pleasure to provide your ER care today - we hope that you feel better.  Rest. Drink adequate fluids.  Take your migraine medication as need.   Follow up with your doctor/headache specialist in the coming week - call office to arrange appointment.  Also, your blood pressure is high today - continue your medication, and follow up with your doctor this week.  Return to ER if worse, new symptoms, fevers, persistent vomiting, numbness/weakness, other concern.   You were given pain medication in the ER - no driving for the next 6 hours.

## 2017-02-21 ENCOUNTER — Ambulatory Visit: Payer: Self-pay | Admitting: Family Medicine

## 2017-07-22 ENCOUNTER — Other Ambulatory Visit: Payer: Self-pay | Admitting: Family Medicine

## 2017-07-22 DIAGNOSIS — I10 Essential (primary) hypertension: Secondary | ICD-10-CM

## 2017-07-29 ENCOUNTER — Other Ambulatory Visit: Payer: Self-pay | Admitting: Family Medicine

## 2017-07-29 NOTE — Telephone Encounter (Signed)
Needs med check visit .  Send 30 day supply only

## 2017-07-29 NOTE — Telephone Encounter (Signed)
Left message for patient to call and schedule med check appointment within the next 30 days.

## 2017-07-29 NOTE — Telephone Encounter (Signed)
Is this ok to refill?  

## 2017-08-02 ENCOUNTER — Other Ambulatory Visit: Payer: Self-pay | Admitting: Medical

## 2017-08-03 NOTE — Telephone Encounter (Signed)
Is this ok to refill?  

## 2017-09-03 ENCOUNTER — Other Ambulatory Visit: Payer: Self-pay | Admitting: Medical

## 2017-09-05 NOTE — Telephone Encounter (Signed)
Is this ok to refill?  

## 2017-09-05 NOTE — Telephone Encounter (Signed)
This is not my patient.  Not sure if her PCP is Vickie or Dr. Redmond School, please see if you can find out and route refill request to them

## 2018-11-04 IMAGING — DX DG CHEST 1V PORT
1 series · 1 of 1 positions shown · non-contrast
Comparison: None.

CLINICAL DATA: Fever.

EXAM:
PORTABLE CHEST 1 VIEW

[chest]
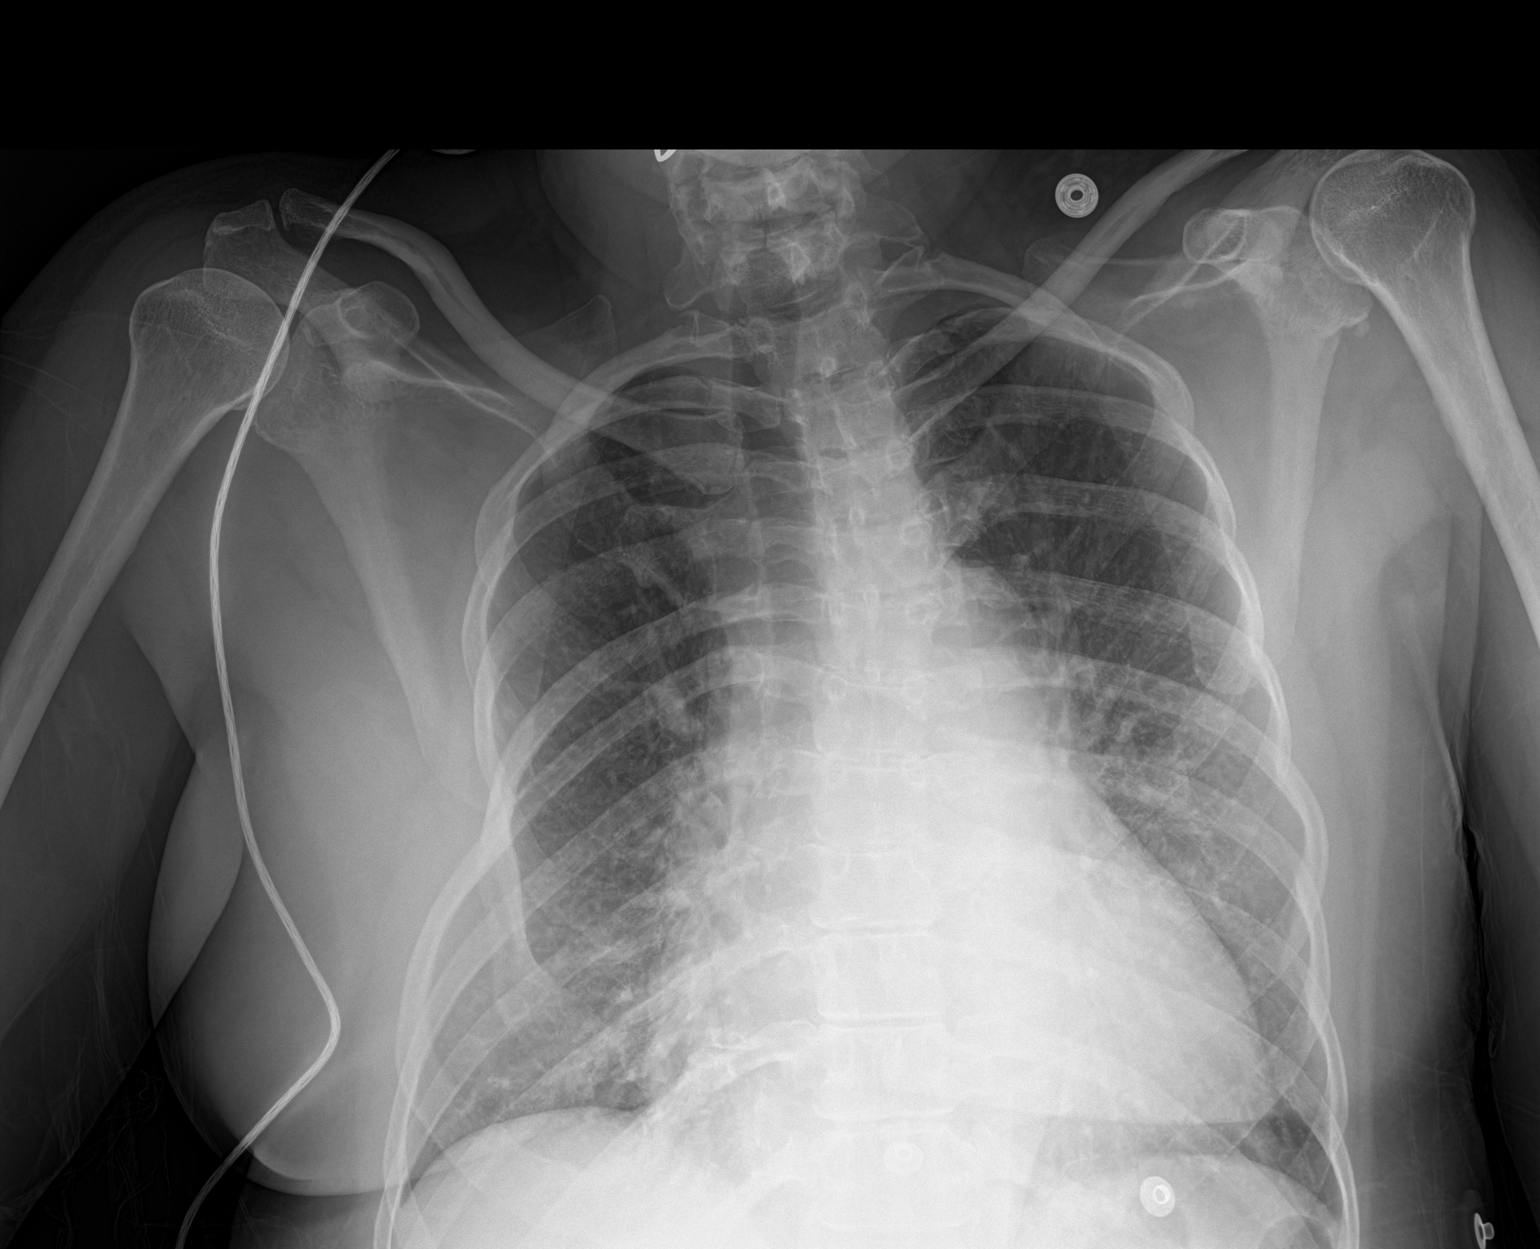

[1 of 1 positions shown; findings below may reference images not displayed]

FINDINGS: Mild cardiomegaly and central pulmonary vascular congestion is
noted. No pneumothorax or pleural effusion is noted. Both lungs are
clear. The visualized skeletal structures are unremarkable.
IMPRESSION: Mild cardiomegaly and central pulmonary vascular congestion. No
acute pulmonary abnormality seen.

## 2018-11-04 IMAGING — CT CT HEAD W/O CM
4 series · 15 of 47 positions shown, 17 images · non-contrast
Comparison: Head CT and brain MRI May 10, 2015

CLINICAL DATA: Headache, primarily left-sided

EXAM:
CT HEAD WITHOUT CONTRAST
TECHNIQUE: Contiguous axial images were obtained from the base of the skull
through the vertex without intravenous contrast.

[Series 7: head without · axial · non-contrast · 0.45mm/px · z∈[-133,-33]mm · 6 of 30 slices shown, 8 images]
[im 5/30  brain]
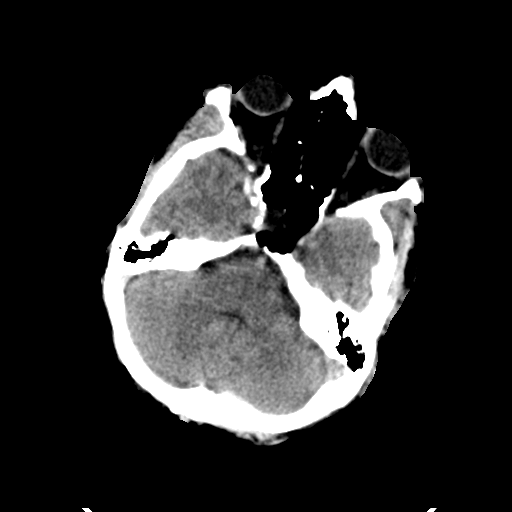
[im 5/30  bone]
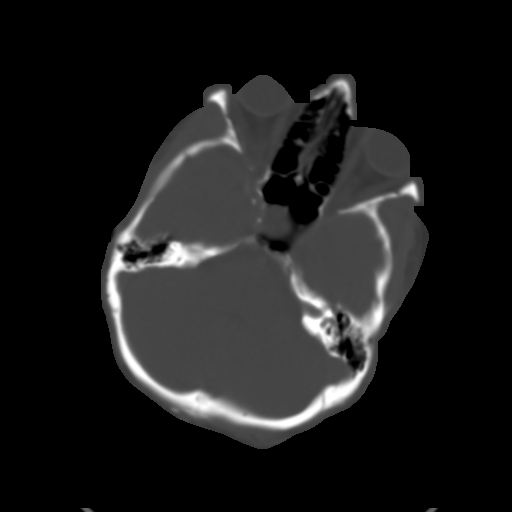
[im 9/30  brain]
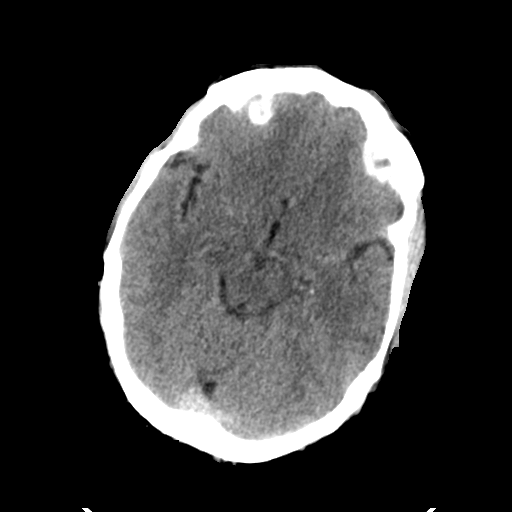
[im 13/30  brain]
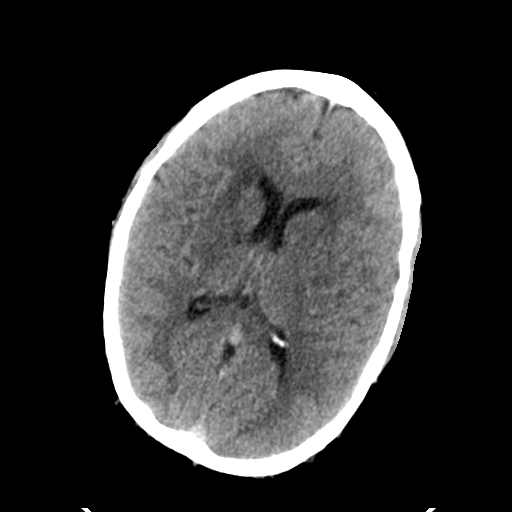
[im 17/30  brain]
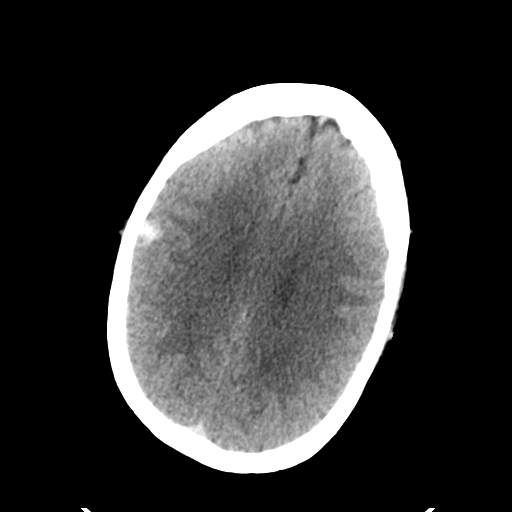
[im 21/30  brain]
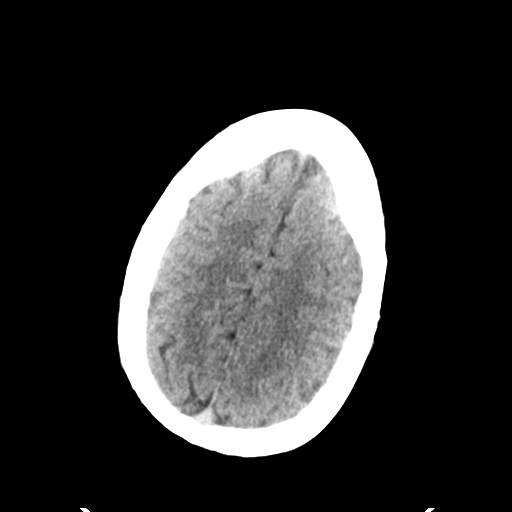
[im 21/30  bone]
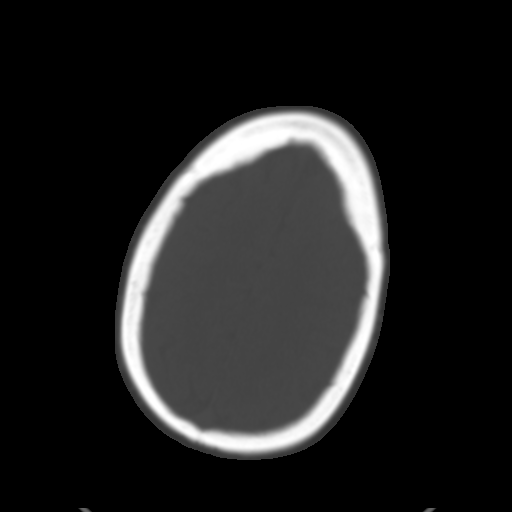
[im 25/30  brain]
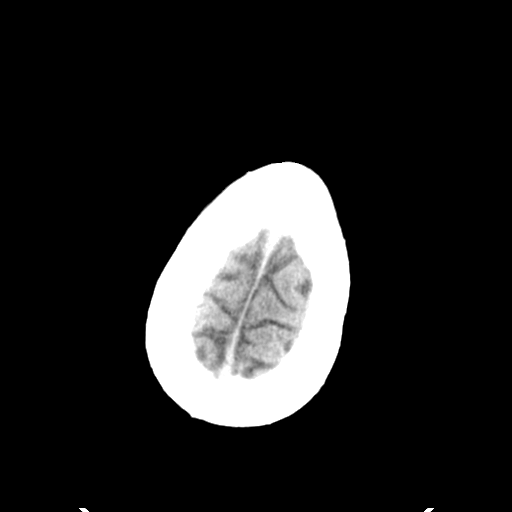

[Series 8: head bone · axial · 0.45mm/px · z∈[-139,-103]mm · 3 of 76 slices shown]
[im 8/76  bone]
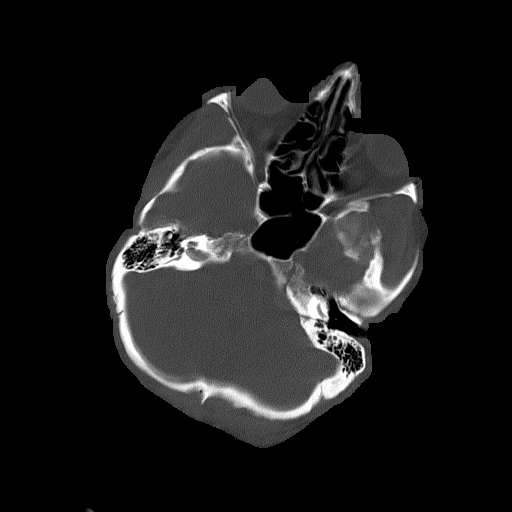
[im 15/76  bone]
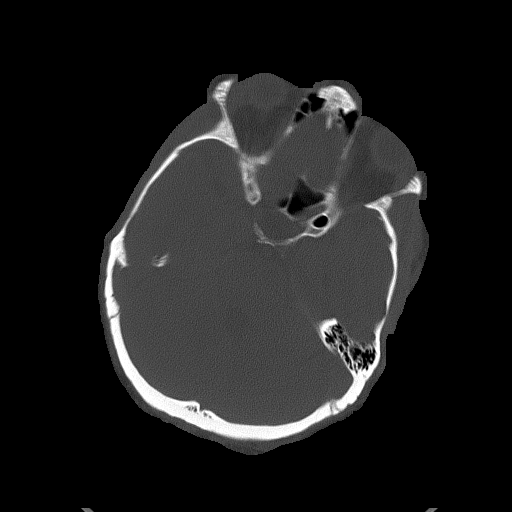
[im 26/76  bone]
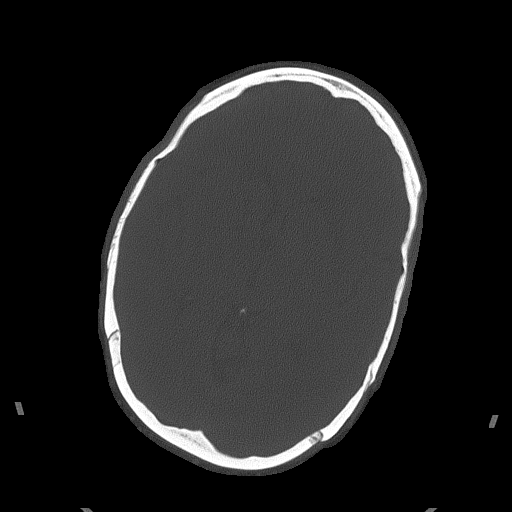

[Series 9: head without cor · coronal · non-contrast · 0.34mm/px · 3 of 67 slices shown]
[im 23/67  brain]
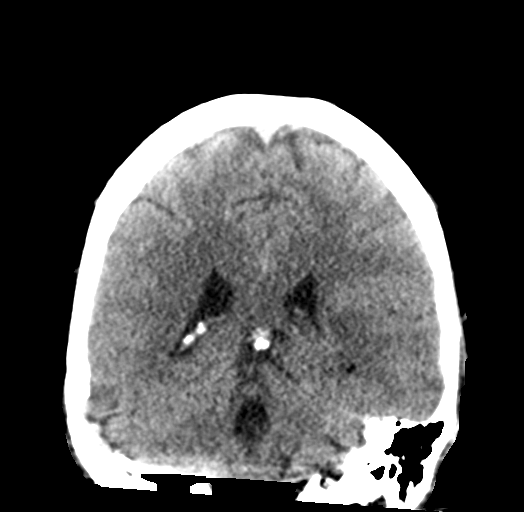
[im 30/67  brain]
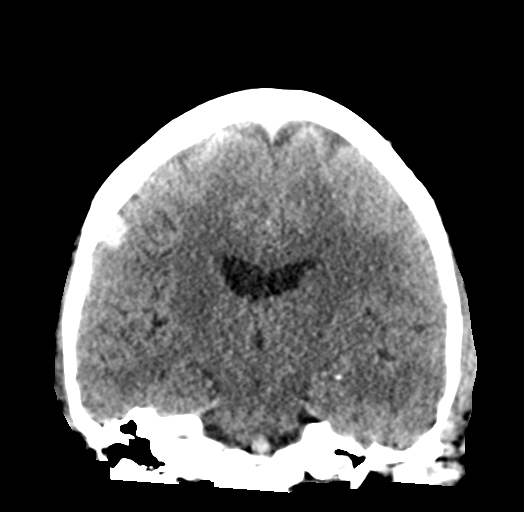
[im 37/67  brain]
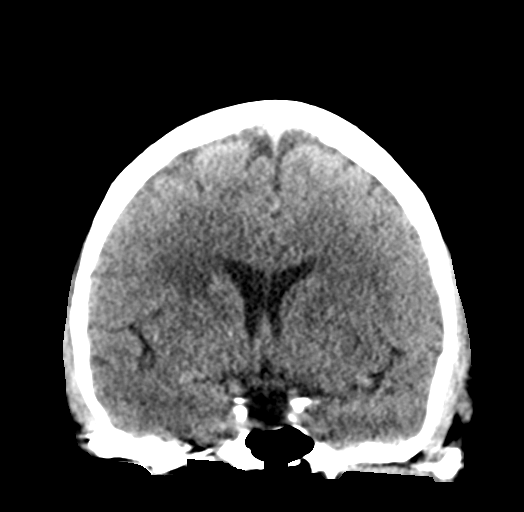

[Series 10: head without sag · sagittal · non-contrast · 0.35mm/px · 3 of 57 slices shown]
[im 19/57  brain]
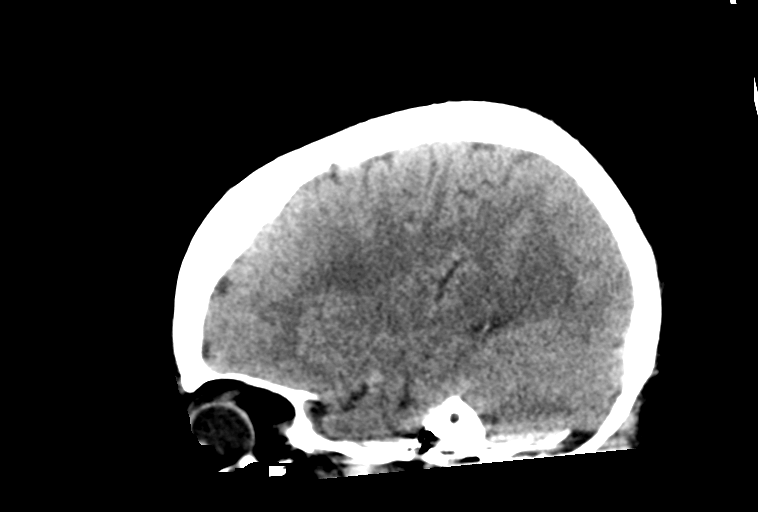
[im 29/57  brain]
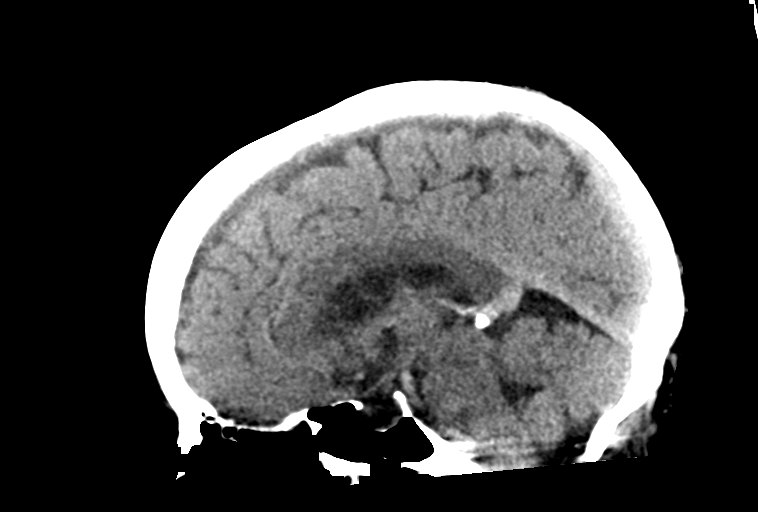
[im 38/57  brain]
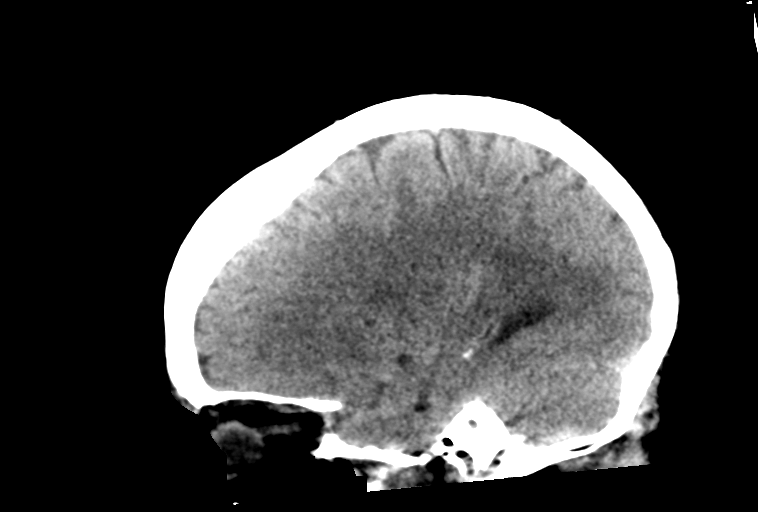

[15 of 47 positions shown; findings below may reference images not displayed]

FINDINGS: Note that there is a degree of motion artifact.

Brain: The ventricles are normal in size and configuration. There is
invagination of CSF into the sella, a stable finding. There is a
stable focal meningioma along the dura of the posterior right
frontal region, measuring 1.3 x 1.2 x 0.9 cm, stable. There is no
surrounding edema, and there is no appreciable mass effect. No other
mass is evident. There is no evident hemorrhage, extra-axial fluid
collection, or midline shift. There is evidence of a prior infarct
at the genu of the right internal capsule. There is small vessel
disease in each external capsule anteriorly as well as in the
anterior limb of the right internal capsule. There is nearby small
vessel disease in the anterior centra semiovale bilaterally.
Elsewhere gray-white compartments appear normal. No acute infarct
evident.

Vascular: There is no appreciable hyperdense vessel. There is
calcification in each carotid siphon.

Skull: Bony calvarium appears intact.

Sinuses/Orbits: There is mucosal thickening in several ethmoid air
cells bilaterally. Other visualized paranasal sinuses are clear.
Orbits appear symmetric bilaterally.

Other: Mastoid air cells are clear.
IMPRESSION: 1.  There is a degree of motion artifact.

2. Stable meningioma posterior right frontal region without
surrounding mass effect or edema. No new mass.

3. Prior infarcts in the basal ganglia regions bilaterally, more on
the right than the left. There is mild small vessel disease in the
anterior centra semiovale bilaterally. No acute infarct evident. No
hemorrhage or extra-axial fluid collection.

4.  There are foci of arterial vascular calcification.

5.  There is mucosal thickening in several ethmoid air cells.

## 2020-09-04 ENCOUNTER — Telehealth: Payer: Self-pay

## 2020-09-04 NOTE — Telephone Encounter (Signed)
LVM for pt to call back to advise if she is still a pt of [facility] and if so to schedule for a med check. [place]

## 2021-09-16 ENCOUNTER — Encounter: Payer: Self-pay | Admitting: Internal Medicine

## 2021-10-20 ENCOUNTER — Encounter: Payer: Self-pay | Admitting: Internal Medicine

## 2021-11-02 ENCOUNTER — Encounter: Payer: Self-pay | Admitting: Internal Medicine

## 2023-03-08 ENCOUNTER — Encounter: Payer: Self-pay | Admitting: Internal Medicine
